# Patient Record
Sex: Male | Born: 1950 | Race: White | Hispanic: No | Smoking: Never smoker
Health system: Southern US, Community
[De-identification: ages and names within clinical notes are randomized; demographics above are authoritative.]

## PROBLEM LIST (undated history)

## (undated) DIAGNOSIS — F419 Anxiety disorder, unspecified: Secondary | ICD-10-CM

## (undated) DIAGNOSIS — F319 Bipolar disorder, unspecified: Secondary | ICD-10-CM

## (undated) DIAGNOSIS — F32A Depression, unspecified: Secondary | ICD-10-CM

## (undated) DIAGNOSIS — M899 Disorder of bone, unspecified: Secondary | ICD-10-CM

## (undated) DIAGNOSIS — F329 Major depressive disorder, single episode, unspecified: Secondary | ICD-10-CM

## (undated) DIAGNOSIS — K219 Gastro-esophageal reflux disease without esophagitis: Secondary | ICD-10-CM

---

## 2003-12-20 ENCOUNTER — Other Ambulatory Visit: Payer: Self-pay

## 2003-12-21 ENCOUNTER — Other Ambulatory Visit: Payer: Self-pay

## 2003-12-22 ENCOUNTER — Other Ambulatory Visit: Payer: Self-pay

## 2003-12-23 ENCOUNTER — Other Ambulatory Visit: Payer: Self-pay

## 2005-11-11 ENCOUNTER — Ambulatory Visit: Payer: Self-pay | Admitting: Surgery

## 2005-11-18 ENCOUNTER — Ambulatory Visit: Payer: Self-pay | Admitting: Surgery

## 2006-01-02 ENCOUNTER — Emergency Department: Payer: Self-pay | Admitting: Emergency Medicine

## 2006-01-04 ENCOUNTER — Ambulatory Visit: Payer: Self-pay

## 2006-01-26 ENCOUNTER — Ambulatory Visit: Payer: Self-pay | Admitting: Surgery

## 2006-02-09 ENCOUNTER — Ambulatory Visit: Payer: Self-pay | Admitting: Surgery

## 2013-09-04 ENCOUNTER — Ambulatory Visit: Payer: Self-pay | Admitting: Gastroenterology

## 2013-09-06 LAB — PATHOLOGY REPORT

## 2014-06-06 ENCOUNTER — Emergency Department: Payer: Self-pay | Admitting: Student

## 2014-11-06 ENCOUNTER — Emergency Department
Admission: EM | Admit: 2014-11-06 | Discharge: 2014-11-06 | Disposition: A | Discharge status: Home / Self Care | Payer: Medicare Other | Attending: Student | Admitting: Student

## 2014-11-06 ENCOUNTER — Emergency Department: Payer: Medicare Other

## 2014-11-06 DIAGNOSIS — Y998 Other external cause status: Secondary | ICD-10-CM | POA: Diagnosis not present

## 2014-11-06 DIAGNOSIS — W182XXA Fall in (into) shower or empty bathtub, initial encounter: Secondary | ICD-10-CM | POA: Insufficient documentation

## 2014-11-06 DIAGNOSIS — S0181XA Laceration without foreign body of other part of head, initial encounter: Secondary | ICD-10-CM | POA: Insufficient documentation

## 2014-11-06 DIAGNOSIS — S0990XA Unspecified injury of head, initial encounter: Secondary | ICD-10-CM | POA: Diagnosis present

## 2014-11-06 DIAGNOSIS — Y9289 Other specified places as the place of occurrence of the external cause: Secondary | ICD-10-CM | POA: Diagnosis not present

## 2014-11-06 DIAGNOSIS — IMO0002 Reserved for concepts with insufficient information to code with codable children: Secondary | ICD-10-CM

## 2014-11-06 DIAGNOSIS — Y93E1 Activity, personal bathing and showering: Secondary | ICD-10-CM | POA: Diagnosis not present

## 2014-11-06 DIAGNOSIS — T148XXA Other injury of unspecified body region, initial encounter: Secondary | ICD-10-CM

## 2014-11-06 HISTORY — DX: Anxiety disorder, unspecified: F41.9

## 2014-11-06 HISTORY — DX: Depression, unspecified: F32.A

## 2014-11-06 HISTORY — DX: Major depressive disorder, single episode, unspecified: F32.9

## 2014-11-06 HISTORY — DX: Bipolar disorder, unspecified: F31.9

## 2014-11-06 HISTORY — DX: Disorder of bone, unspecified: M89.9

## 2014-11-06 HISTORY — DX: Gastro-esophageal reflux disease without esophagitis: K21.9

## 2014-11-06 MED ORDER — LIDOCAINE-EPINEPHRINE-TETRACAINE (LET) SOLUTION
3.0000 mL | Freq: Once | NASAL | Status: AC
  Administered 2014-11-06: 3 mL via TOPICAL

## 2014-11-06 MED ORDER — LIDOCAINE-EPINEPHRINE-TETRACAINE (LET) SOLUTION
NASAL | Status: AC
  Filled 2014-11-06: qty 3

## 2014-11-06 MED ORDER — TETANUS-DIPHTHERIA TOXOIDS TD 5-2 LFU IM INJ
INJECTION | INTRAMUSCULAR | Status: AC
  Filled 2014-11-06: qty 0.5

## 2014-11-06 MED ORDER — TETANUS-DIPHTHERIA TOXOIDS TD 5-2 LFU IM INJ
0.5000 mL | INJECTION | Freq: Once | INTRAMUSCULAR | Status: AC
  Administered 2014-11-06: 0.5 mL via INTRAMUSCULAR

## 2014-11-06 NOTE — Discharge Instructions (Signed)
Rest. Keep wound clean and dry.  Follow-up with her primary care physician next week.  Return to the ER for new or worsening concerns.  Contusion A contusion is a deep bruise. Contusions happen when an injury causes bleeding under the skin. Signs of bruising include pain, puffiness (swelling), and discolored skin. The contusion may turn blue, purple, or yellow. HOME CARE   Put ice on the injured area.  Put ice in a plastic bag.  Place a towel between your skin and the bag.  Leave the ice on for 15-20 minutes, 03-04 times a day.  Only take medicine as told by your doctor.  Rest the injured area.  If possible, raise (elevate) the injured area to lessen puffiness. GET HELP RIGHT AWAY IF:   You have more bruising or puffiness.  You have pain that is getting worse.  Your puffiness or pain is not helped by medicine. MAKE SURE YOU:   Understand these instructions.  Will watch your condition.  Will get help right away if you are not doing well or get worse. Document Released: 11/24/2007 Document Revised: 08/30/2011 Document Reviewed: 04/12/2011 [facility] Patient Information 2015 [place]. This information is not intended to replace advice given to you by your health care provider. Make sure you discuss any questions you have with your health care provider.

## 2014-11-06 NOTE — ED Provider Notes (Signed)
[facility] Department Provider Note  ____________________________________________  Time seen: Approximately 9:15 AM  I have reviewed the triage vital signs and the nursing notes.   HISTORY  Chief Complaint Fall historian includes patient in group home staff  HPI Warren NeighborRalph M Cirrincione Jr. is a 64 y.o. male presents to the ER due to laceration sustained from fall this morning while in the shower. Patient states he was in the shower and accidentally slipped and fell forward and hit head on side of tub. Patient and staff denies loss of consciousness. Reports patient quickly then got up and finished his shower. Patient denies headache or loss of consciousness, nausea, vomiting. Reports some neck pain. States mild achy.  Group home staff denies behavior changes or personality changes. Fall occurred at approximately 7 AM. Patient denies headache vision changes nausea or vomiting. Patient denies current neck or back pain.   Past Medical History  Diagnosis Date  . Bipolar 1 disorder   . Depression   . Anxiety   . GERD (gastroesophageal reflux disease)   . Bone disorder     There are no active problems to display for this patient.  PCP: niemeyer History reviewed. No pertinent past surgical history.  No current outpatient prescriptions on file.  Allergies Review of patient's allergies indicates no known allergies.  History reviewed. No pertinent family history.  Social History History  Substance Use Topics  . Smoking status: Never Smoker   . Smokeless tobacco: Not on file  . Alcohol Use: No    Review of Systems Constitutional: No fever/chills Eyes: No visual changes. ENT: No sore throat. Cardiovascular: Denies chest pain. Respiratory: Denies shortness of breath. Gastrointestinal: No abdominal pain.  No nausea, no vomiting.  No diarrhea.  No constipation. Genitourinary: Negative for dysuria. Musculoskeletal: Negative for back pain.neck pain as  above Skin: laceration to forehead Neurological: Negative for headaches, focal weakness or numbness.  10-point ROS otherwise negative.  ____________________________________________   PHYSICAL EXAM:  VITAL SIGNS: ED Triage Vitals  Enc Vitals Group     BP --      Pulse Rate 11/06/14 0857 85     Resp 11/06/14 0857 18     Temp 11/06/14 0857 97.4 F (36.3 C)     Temp Source 11/06/14 0857 Oral     SpO2 11/06/14 0857 95 %     Weight 11/06/14 0857 195 lb (88.451 kg)     Height 11/06/14 0857 6' (1.829 m)     Head Cir --      Peak Flow --      Pain Score --      Pain Loc --      Pain Edu? --      Excl. in GC? --     Constitutional: Alert and oriented. Well appearing and in no acute distress. Eyes: Conjunctivae are normal. PERRL. EOMI. Head: forehead with superficial flap laceration 2cm. Nontender, no swelling, clean. No bleeding.  Nose: No congestion/rhinnorhea. Mouth/Throat: Mucous membranes are moist.  Oropharynx non-erythematous. Neck: No stridor.  NO cervical spine tenderness to palpation. Hematological/Lymphatic/Immunilogical: No cervical lymphadenopathy. Cardiovascular: Normal rate, regular rhythm. Grossly normal heart sounds.  Good peripheral circulation. Respiratory: Normal respiratory effort.  No retractions. Lungs CTAB. Gastrointestinal: Soft and nontender. No distention. No abdominal bruits. No CVA tenderness. Musculoskeletal: No lower extremity tenderness nor edema.  No joint effusions. Neurologic:  Normal speech and language. No gross focal neurologic deficits are appreciated. Speech is normal. No gait instability. Skin:  Skin is warm, dry  and intact. No rash noted. Psychiatric: Mood and affect are normal. Speech and behavior are normal.  ____________________________________________ __________________________________________  RADIOLOGY  CT HEAD WITHOUT CONTRAST  CT CERVICAL SPINE WITHOUT CONTRAST  TECHNIQUE: Multidetector CT imaging of the head and cervical  spine was performed following the standard protocol without intravenous contrast. Multiplanar CT image reconstructions of the cervical spine were also generated.  COMPARISON: None.  FINDINGS: CT HEAD FINDINGS  No acute stroke, acute hemorrhage, mass lesion, or extra-axial fluid. Hydrocephalus ex vacuo. Hypoattenuation of white matter likely representing small vessel disease. Generalized cerebral and cerebellar atrophy.  No skull fracture. Vascular calcification. No significant scalp hematoma or laceration.  CT CERVICAL SPINE FINDINGS  There is no visible cervical spine fracture, traumatic subluxation, prevertebral soft tissue swelling, or intraspinal hematoma. Multilevel disc space narrowing. Functional fusion due to loss of interspace height across C5-C6. Facet mediated degenerative anterolisthesis C6-7 and C7-T1 non worrisome. No pneumothorax. Mild pannus surrounds the odontoid. No neck masses.  IMPRESSION: Chronic changes of cerebral atrophy as described. No skull fracture or intracranial hemorrhage.  Cervical spondylosis without cervical spine fracture or traumatic subluxation.   Electronically Signed By: Davonna BellingJohn Curnes M.D. On: 11/06/2014 10:49 ____________________________________________   PROCEDURES  LACERATION REPAIR Performed by: [name] Authorized by: [name] Consent: Verbal consent obtained. Risks and benefits: risks, benefits and alternatives were discussed Consent given by: patient Patient identity confirmed: provided demographic data Prepped and Draped in normal sterile fashion Wound explored  Laceration Location: forehead  Laceration Length: 2cm superficial   No Foreign Bodies seen or palpated  Anesthesia:topical   Local anesthetic: LET  Irrigation method: syringe Amount of cleaning: copious  Skin closure: closed with topical sterile adhesive  Patient tolerance: Patient tolerated the procedure well with no immediate  complications. ____________________________________________   INITIAL IMPRESSION / ASSESSMENT AND PLAN / ED COURSE  Pertinent labs & imaging results that were available during my care of the patient were reviewed by me and considered in my medical decision making (see chart for details).  Tetanus updated  No acute distress. Very well-appearing. No pain or tenderness elicited on exam. Presented post mechanical fall this morning 7 AM slipped in shower fell forward hitting forehead on the side of tub. Denies loss of consciousness. Group home staff denies behavior changes. Alert and oriented with steady gait. CT head and cervical spine negative for acute changes. Superficial laceration repaired. Discussed follow-up with primary care physician next week. Discussed follow-up and return parameters. Patient and group home staff verbalized understanding and agreed to plan. ____________________________________________   FINAL CLINICAL IMPRESSION(S) / ED DIAGNOSES  Final diagnoses:  Laceration  Contusion      Renford DillsLindsey Daouda Lonzo, NP 11/06/14 1302  Gayla DossEryka A Gayle, MD 11/06/14 [phone]

## 2014-11-06 NOTE — ED Notes (Signed)
Pt fell and hit forehead in the bathtub this am. No loc.

## 2014-12-14 ENCOUNTER — Encounter: Payer: Self-pay | Admitting: Emergency Medicine

## 2014-12-14 ENCOUNTER — Emergency Department
Admission: EM | Admit: 2014-12-14 | Discharge: 2014-12-14 | Disposition: A | Discharge status: Home / Self Care | Payer: Medicare Other | Attending: Emergency Medicine | Admitting: Emergency Medicine

## 2014-12-14 ENCOUNTER — Emergency Department: Payer: Medicare Other

## 2014-12-14 DIAGNOSIS — S0083XA Contusion of other part of head, initial encounter: Secondary | ICD-10-CM

## 2014-12-14 DIAGNOSIS — Y9389 Activity, other specified: Secondary | ICD-10-CM | POA: Diagnosis not present

## 2014-12-14 DIAGNOSIS — S0990XA Unspecified injury of head, initial encounter: Secondary | ICD-10-CM | POA: Diagnosis not present

## 2014-12-14 DIAGNOSIS — W01190A Fall on same level from slipping, tripping and stumbling with subsequent striking against furniture, initial encounter: Secondary | ICD-10-CM | POA: Diagnosis not present

## 2014-12-14 DIAGNOSIS — W19XXXA Unspecified fall, initial encounter: Secondary | ICD-10-CM

## 2014-12-14 DIAGNOSIS — Y998 Other external cause status: Secondary | ICD-10-CM | POA: Insufficient documentation

## 2014-12-14 DIAGNOSIS — S0993XA Unspecified injury of face, initial encounter: Secondary | ICD-10-CM | POA: Diagnosis present

## 2014-12-14 DIAGNOSIS — Y92002 Bathroom of unspecified non-institutional (private) residence single-family (private) house as the place of occurrence of the external cause: Secondary | ICD-10-CM | POA: Insufficient documentation

## 2014-12-14 DIAGNOSIS — S0011XA Contusion of right eyelid and periocular area, initial encounter: Secondary | ICD-10-CM | POA: Insufficient documentation

## 2014-12-14 NOTE — ED Provider Notes (Signed)
[facility] Emergency Department Provider Note  ____________________________________________  Time seen: Approximately 11:25 AM  I have reviewed the triage vital signs and the nursing notes.   HISTORY  Chief Complaint Fall   HPI Warren [name]. is a 64 y.o. male presents to ER with group home representative at bedside post fall. Patient and group home and presented states that patient has been getting in the shower without assistance, as he knows that he needs to wait for assistance. Patient reports that as he was stepping into the shower his foot slipped and he fell forward hitting right face on tub edge. Group home representative reports that he was in the next room and was in the bathroom within seconds after fall. Reports no LOC. Reports patient ambulatory since fall. Reports fall occurred approximately 9:30 this morning.  Patient complains of right facial pain at 5 out of 10 states it source. Denies other pain or injuries. Also reports mild neck pain, but states he has had neck pain for years.   Denies chest pain, shortness of breath, abdominal pain,  back pain or weakness.   Past Medical History  Diagnosis Date  . Bipolar 1 disorder   . Depression   . Anxiety   . GERD (gastroesophageal reflux disease)   . Bone disorder     There are no active problems to display for this patient.   History reviewed. No pertinent past surgical history.  No current outpatient prescriptions on file.  Allergies Review of patient's allergies indicates no known allergies.  No family history on file.  Social History History  Substance Use Topics  . Smoking status: Never Smoker   . Smokeless tobacco: Not on file  . Alcohol Use: No    Review of Systems Constitutional: No fever/chills Eyes: No visual changes. Denies vision changes, blurry vision.  ENT: No sore throat. Cardiovascular: Denies chest pain. Respiratory: Denies shortness of  breath. Gastrointestinal: No abdominal pain.  No nausea, no vomiting.  No diarrhea.  No constipation. Genitourinary: Negative for dysuria. Musculoskeletal: Negative for back pain. Skin: Negative for rash. Neurological: Negative for headaches, focal weakness or numbness.  10-point ROS otherwise negative.  ____________________________________________   PHYSICAL EXAM:  VITAL SIGNS: ED Triage Vitals  Enc Vitals Group     BP 12/14/14 1048 154/94 mmHg     Pulse Rate 12/14/14 1048 71     Resp 12/14/14 1048 18     Temp 12/14/14 1048 97.3 F (36.3 C)     Temp Source 12/14/14 1048 Oral     SpO2 12/14/14 1048 100 %     Weight 12/14/14 1048 190 lb (86.183 kg)     Height 12/14/14 1048 6' (1.829 m)     Head Cir --      Peak Flow --      Pain Score 12/14/14 1049 5     Pain Loc --      Pain Edu? --      Excl. in GC? --     Constitutional: Alert and oriented. Well appearing and in no acute distress. Eyes: Conjunctivae are normal. PERRL. EOMI. Head: right inferior orbit mild to mod TTP, with mild ecchymosis. Nose: No congestion/rhinnorhea. Mouth/Throat: Mucous membranes are moist.  Oropharynx non-erythematous. Neck: No stridor. Minimal cervical spine tenderness to palpation. Full ROM.  Hematological/Lymphatic/Immunilogical: No cervical lymphadenopathy. Cardiovascular: Normal rate, regular rhythm. Grossly normal heart sounds.  Good peripheral circulation. Respiratory: Normal respiratory effort.  No retractions. Lungs CTAB. Gastrointestinal: Soft and nontender. No distention.  No abdominal bruits. No CVA tenderness. Musculoskeletal: No lower extremity tenderness nor edema.  No joint effusions. no thoracic or lumbar tenderness to palpation. Patient ambulatory in) room with steady gait. Patient changes positions quickly without discomfort or distress. Arms and legs nontender with full ROM. Neurologic:  Normal speech and language. No gross focal neurologic deficits are appreciated. Speech is  normal. No gait instability. Skin:  Skin is warm, dry and intact. No rash noted. Psychiatric: Mood and affect are normal. Speech and behavior are normal.  ____________________________________________   RADIOLOGY EXAM: CT HEAD WITHOUT CONTRAST  CT MAXILLOFACIAL WITHOUT CONTRAST  CT CERVICAL SPINE WITHOUT CONTRAST  TECHNIQUE: Multidetector CT imaging of the head, cervical spine, and maxillofacial structures were performed using the standard protocol without intravenous contrast. Multiplanar CT image reconstructions of the cervical spine and maxillofacial structures were also generated.  COMPARISON: Head and C-spine CT - 11/06/2014  FINDINGS: CT HEAD FINDINGS  Similar findings of advanced atrophy with diffuse sulcal prominence centralized volume loss with commensurate ex vacuo dilatation of the ventricular system. Scattered minimal periventricular hypodensities compatible microvascular ischemic disease. The gray-white differentiation is otherwise well maintained without CT evidence of acute large territory infarct. No intraparenchymal or extra-axial mass or hemorrhage. Unchanged size and configuration of the ventricles and basilar cisterns. No midline shift. Intracranial atherosclerosis.  Regional soft tissues appear normal. No displaced calvarial fracture.  CT MAXILLOFACIAL FINDINGS  There is mild soft tissue swelling about the right maxilla (image 30, series 3). No associated radiopaque foreign body or displaced facial fracture.  Polypoid mucosal thickening of the right frontal sinus. The remaining paranasal sinuses and mastoid air cells are normally aerated. No air-fluid levels.  Normal appearance of the bilateral pterygoid plates. No displaced nasal bone fracture. Normal noncontrast appearance of the bilateral orbits and globes. Normal appearance of the mandible. The bilateral mandibular condyles are normally located. Mild degenerative change of the left TMJ.  The patient is edentulous.  CT CERVICAL SPINE FINDINGS  C1 to the superior endplate of T3 is imaged.  There is mild straightening of the expected cervical lordosis. No anterolisthesis or retrolisthesis. The dens is normally positioned between the lateral masses of C1. Normal atlantodental and toe axial articulations. The bilateral facets are normally aligned.  No fracture or static subluxation of the cervical spine. Cervical vertebral body heights are preserved. Prevertebral soft tissues are normal.  There is suspected partial ankylosis of the C5-C6 intervertebral disc space. Mild multilevel cervical spine DDD, worse at C4-C5 and C6-C7 with disc space height loss, endplate irregularity and sclerosis.  Scattered shotty bilateral cervical lymph nodes individually not enlarged by size criteria. There is mild diffuse heterogeneity of the thyroid gland with punctate calcification within the left lobe of the thyroid. No discrete thyroid nodules this noncontrast examination. Limited visualization of lung apices is normal.  IMPRESSION: 1. Mild soft tissue swelling about the right maxilla without associated radiopaque foreign body or displaced facial fracture. 2. Similar findings of atrophy and microvascular ischemic disease without acute intracranial process. 3. No fracture or static subluxation of the cervical spine. 4. Partial ankylosis of the C5-C6 intervertebral disc space. 5. Mild multilevel cervical spine DDD, worse the C4- C5 and C6-C7.   Electronically Signed By: Simonne Come M.D. On: 12/14/2014 12:48  ____________________________________________   ____________________________________________   INITIAL IMPRESSION / ASSESSMENT AND PLAN / ED COURSE  Pertinent labs & imaging results that were available during my care of the patient were reviewed by me and considered in my medical decision making (see  chart for details).   presents the ER with group home  representative post mechanical fall. Patient slipped while getting into the bathtub. Staff reports they were in the next room and were with patient within seconds. No LOC. Patient denies complaints other than mild tenderness to right face. CT head maxillofacial and CT cervical spine negative for acute changes  Except mild soft tissue swelling about the right maxilla without associated foreign body or fracture. Patient to apply ice rest. Follow-up with primary care physician next week. Patient and group home staff or verbalize understanding. ____________________________________________   FINAL CLINICAL IMPRESSION(S) / ED DIAGNOSES  Final diagnoses:  Facial contusion, initial encounter  Fall, initial encounter      Renford Dills, NP 12/14/14 1327  Myrna Blazer, MD 12/14/14 [phone]

## 2014-12-14 NOTE — Discharge Instructions (Signed)
Apply ice. Rest.Have staff assist you in restroom. Follow-up with your primary care physician next week. Return to the ER for new or worsening concerns.  Contusion A contusion is a deep bruise. Contusions are the result of an injury that caused bleeding under the skin. The contusion may turn blue, purple, or yellow. Minor injuries will give you a painless contusion, but more severe contusions may stay painful and swollen for a few weeks.  CAUSES  A contusion is usually caused by a blow, trauma, or direct force to an area of the body. SYMPTOMS   Swelling and redness of the injured area.  Bruising of the injured area.  Tenderness and soreness of the injured area.  Pain. DIAGNOSIS  The diagnosis can be made by taking a history and physical exam. An X-ray, CT scan, or MRI may be needed to determine if there were any associated injuries, such as fractures. TREATMENT  Specific treatment will depend on what area of the body was injured. In general, the best treatment for a contusion is resting, icing, elevating, and applying cold compresses to the injured area. Over-the-counter medicines may also be recommended for pain control. Ask your caregiver what the best treatment is for your contusion. HOME CARE INSTRUCTIONS   Put ice on the injured area.  Put ice in a plastic bag.  Place a towel between your skin and the bag.  Leave the ice on for 15-20 minutes, 3-4 times a day, or as directed by your health care provider.  Only take over-the-counter or prescription medicines for pain, discomfort, or fever as directed by your caregiver. Your caregiver may recommend avoiding anti-inflammatory medicines (aspirin, ibuprofen, and naproxen) for 48 hours because these medicines may increase bruising.  Rest the injured area.  If possible, elevate the injured area to reduce swelling. SEEK IMMEDIATE MEDICAL CARE IF:   You have increased bruising or swelling.  You have pain that is getting  worse.  Your swelling or pain is not relieved with medicines. MAKE SURE YOU:   Understand these instructions.  Will watch your condition.  Will get help right away if you are not doing well or get worse. Document Released: 03/17/2005 Document Revised: 06/12/2013 Document Reviewed: 04/12/2011 [facility] Patient Information 2015 [place], [place]. This information is not intended to replace advice given to you by your health care provider. Make sure you discuss any questions you have with your health care provider.  Facial or Scalp Contusion  A facial or scalp contusion is a deep bruise on the face or head. Contusions happen when an injury causes bleeding under the skin. Signs of bruising include pain, puffiness (swelling), and discolored skin. The contusion may turn blue, purple, or yellow. HOME CARE  Only take medicines as told by your doctor.  Put ice on the injured area.  Put ice in a plastic bag.  Place a towel between your skin and the bag.  Leave the ice on for 20 minutes, 2-3 times a day. GET HELP IF:  You have bite problems.  You have pain when chewing.  You are worried about your face not healing normally. GET HELP RIGHT AWAY IF:   You have severe pain or a headache and medicine does not help.  You are very tired or confused, or your personality changes.  You throw up (vomit).  You have a nosebleed that will not stop.  You see two of everything (double vision) or have blurry vision.  You have fluid coming from your nose or ear.  You have problems walking or using your arms or legs. MAKE SURE YOU:   Understand these instructions.  Will watch your condition.  Will get help right away if you are not doing well or get worse. Document Released: 05/27/2011 Document Revised: 03/28/2013 Document Reviewed: 01/18/2013 [facility] Patient Information 2015 [place], [place]. This information is not intended to replace advice given to you by your health care provider. Make  sure you discuss any questions you have with your health care provider.  Fall Prevention and Home Safety Falls cause injuries and can affect all age groups. It is possible to prevent falls.  HOW TO PREVENT FALLS  Wear shoes with rubber soles that do not have an opening for your toes.  Keep the inside and outside of your house well lit.  Use night lights throughout your home.  Remove clutter from floors.  Clean up floor spills.  Remove throw rugs or fasten them to the floor with carpet tape.  Do not place electrical cords across pathways.  Put grab bars by your tub, shower, and toilet. Do not use towel bars as grab bars.  Put handrails on both sides of the stairway. Fix loose handrails.  Do not climb on stools or stepladders, if possible.  Do not wax your floors.  Repair uneven or unsafe sidewalks, walkways, or stairs.  Keep items you use a lot within reach.  Be aware of pets.  Keep emergency numbers next to the telephone.  Put smoke detectors in your home and near bedrooms. Ask your doctor what other things you can do to prevent falls. Document Released: 04/03/2009 Document Revised: 12/07/2011 Document Reviewed: 09/07/2011 [facility] Patient Information 2015 [place], [place]. This information is not intended to replace advice given to you by your health care provider. Make sure you discuss any questions you have with your health care provider.

## 2014-12-14 NOTE — ED Notes (Signed)
Pt states he was getting out of the shower and his face hit something in the in the shower. Bruising and hematoma noted under right eye. Pt denies LOC denies taking blood thinner medication. Pt reports blurry vision to right eye rates pain 8/10.

## 2014-12-14 NOTE — ED Notes (Signed)
Pt's caregiver states pt fell while getting out of the shower this am, hitting right side of face on tub, bruising and hematoma noted to right side of face, denies any LOC

## 2015-01-03 ENCOUNTER — Emergency Department: Payer: Medicare Other

## 2015-01-03 ENCOUNTER — Encounter: Payer: Self-pay | Admitting: Emergency Medicine

## 2015-01-03 ENCOUNTER — Emergency Department
Admission: EM | Admit: 2015-01-03 | Discharge: 2015-01-03 | Disposition: A | Discharge status: Home / Self Care | Payer: Medicare Other | Attending: Emergency Medicine | Admitting: Emergency Medicine

## 2015-01-03 DIAGNOSIS — Y998 Other external cause status: Secondary | ICD-10-CM | POA: Insufficient documentation

## 2015-01-03 DIAGNOSIS — Y9389 Activity, other specified: Secondary | ICD-10-CM | POA: Diagnosis not present

## 2015-01-03 DIAGNOSIS — W182XXA Fall in (into) shower or empty bathtub, initial encounter: Secondary | ICD-10-CM | POA: Diagnosis not present

## 2015-01-03 DIAGNOSIS — Y9289 Other specified places as the place of occurrence of the external cause: Secondary | ICD-10-CM | POA: Diagnosis not present

## 2015-01-03 DIAGNOSIS — F419 Anxiety disorder, unspecified: Secondary | ICD-10-CM | POA: Insufficient documentation

## 2015-01-03 DIAGNOSIS — S00212A Abrasion of left eyelid and periocular area, initial encounter: Secondary | ICD-10-CM | POA: Insufficient documentation

## 2015-01-03 DIAGNOSIS — S0012XA Contusion of left eyelid and periocular area, initial encounter: Secondary | ICD-10-CM | POA: Diagnosis not present

## 2015-01-03 DIAGNOSIS — S0990XA Unspecified injury of head, initial encounter: Secondary | ICD-10-CM | POA: Diagnosis present

## 2015-01-03 DIAGNOSIS — S0083XA Contusion of other part of head, initial encounter: Secondary | ICD-10-CM

## 2015-01-03 LAB — CBC
HCT: 35.6 % — ABNORMAL LOW (ref 40.0–52.0)
Hemoglobin: 11.9 g/dL — ABNORMAL LOW (ref 13.0–18.0)
MCH: 32.6 pg (ref 26.0–34.0)
MCHC: 33.4 g/dL (ref 32.0–36.0)
MCV: 97.4 fL (ref 80.0–100.0)
PLATELETS: 177 10*3/uL (ref 150–440)
RBC: 3.65 MIL/uL — AB (ref 4.40–5.90)
RDW: 12.3 % (ref 11.5–14.5)
WBC: 8.3 10*3/uL (ref 3.8–10.6)

## 2015-01-03 LAB — BASIC METABOLIC PANEL
ANION GAP: 3 — AB (ref 5–15)
BUN: 23 mg/dL — AB (ref 6–20)
CHLORIDE: 110 mmol/L (ref 101–111)
CO2: 28 mmol/L (ref 22–32)
CREATININE: 1.12 mg/dL (ref 0.61–1.24)
Calcium: 8.7 mg/dL — ABNORMAL LOW (ref 8.9–10.3)
GFR calc Af Amer: >60 mL/min (ref >60)
Glucose, Bld: 95 mg/dL (ref 65–99)
POTASSIUM: 3.7 mmol/L (ref 3.5–5.1)
Sodium: 141 mmol/L (ref 135–145)

## 2015-01-03 NOTE — Discharge Instructions (Signed)
Contusion °A contusion is a deep bruise. Contusions are the result of an injury that caused bleeding under the skin. The contusion may turn blue, purple, or yellow. Minor injuries will give you a painless contusion, but more severe contusions may stay painful and swollen for a few weeks.  °CAUSES  °A contusion is usually caused by a blow, trauma, or direct force to an area of the body. °SYMPTOMS  °· Swelling and redness of the injured area. °· Bruising of the injured area. °· Tenderness and soreness of the injured area. °· Pain. °DIAGNOSIS  °The diagnosis can be made by taking a history and physical exam. An X-ray, CT scan, or MRI may be needed to determine if there were any associated injuries, such as fractures. °TREATMENT  °Specific treatment will depend on what area of the body was injured. In general, the best treatment for a contusion is resting, icing, elevating, and applying cold compresses to the injured area. Over-the-counter medicines may also be recommended for pain control. Ask your caregiver what the best treatment is for your contusion. °HOME CARE INSTRUCTIONS  °· Put ice on the injured area. °¨ Put ice in a plastic bag. °¨ Place a towel between your skin and the bag. °¨ Leave the ice on for 15-20 minutes, 3-4 times a day, or as directed by your health care provider. °· Only take over-the-counter or prescription medicines for pain, discomfort, or fever as directed by your caregiver. Your caregiver may recommend avoiding anti-inflammatory medicines (aspirin, ibuprofen, and naproxen) for 48 hours because these medicines may increase bruising. °· Rest the injured area. °· If possible, elevate the injured area to reduce swelling. °SEEK IMMEDIATE MEDICAL CARE IF:  °· You have increased bruising or swelling. °· You have pain that is getting worse. °· Your swelling or pain is not relieved with medicines. °MAKE SURE YOU:  °· Understand these instructions. °· Will watch your condition. °· Will get help right  away if you are not doing well or get worse. °Document Released: 03/17/2005 Document Revised: 06/12/2013 Document Reviewed: 04/12/2011 °[employer]® Patient Information ©2015 [employer]. This information is not intended to replace advice given to you by your health care provider. Make sure you discuss any questions you have with your health care provider. ° °

## 2015-01-03 NOTE — ED Provider Notes (Signed)
[facility] Emergency Department Provider Note  ____________________________________________  Time seen: On arrival  I have reviewed the triage vital signs and the nursing notes.   HISTORY  Chief Complaint Fall and Head Injury    HPI Warren NeighborRalph M Santy Jr. is a 64 y.o. male who presents after a fall. Patient is from his group home with group home representative reports that patient slipped while getting into the shower and struck his face. Patient complains only of pain on the left eye and cheek area. No neck pain. Abdominal pain. No chest pain. No extremity pain. No loss of consciousness reported. Patient otherwise feels well     Past Medical History  Diagnosis Date  . Bipolar 1 disorder   . Depression   . Anxiety   . GERD (gastroesophageal reflux disease)   . Bone disorder     There are no active problems to display for this patient.   History reviewed. No pertinent past surgical history.  No current outpatient prescriptions on file.  Allergies Review of patient's allergies indicates no known allergies.  No family history on file.  Social History History  Substance Use Topics  . Smoking status: Never Smoker   . Smokeless tobacco: Not on file  . Alcohol Use: No    Review of Systems  Constitutional: Negative for fever. Eyes: Negative for visual changes.  Cardiovascular: Negative for chest pain. Respiratory: Negative for shortness of breath. Gastrointestinal: Negative for abdominal paina.  Musculoskeletal: Negative for back pain. No extremity injury Skin: Positive for abrasion Neurological: Negative for headaches or focal weakness Psychiatric: Positive for anxiety  10-point ROS otherwise negative.  ____________________________________________   PHYSICAL EXAM:  VITAL SIGNS: ED Triage Vitals  Enc Vitals Group     BP --      Pulse Rate 01/03/15 1112 64     Resp 01/03/15 1112 18     Temp 01/03/15 1112 98.1 F (36.7 C)   Temp Source 01/03/15 1112 Oral     SpO2 01/03/15 1112 97 %     Weight 01/03/15 1112 190 lb (86.183 kg)     Height 01/03/15 1112 6' (1.829 m)     Head Cir --      Peak Flow --      Pain Score 01/03/15 1112 2     Pain Loc --      Pain Edu? --      Excl. in GC? --      Constitutional: Alert and oriented. Well appearing and in no distress. Eyes: Conjunctivae are normal.  ENT   Head: Normocephalic swelling and bruising around the left eye   Mouth/Throat: Mucous membranes are moist. Cardiovascular: Normal rate, regular rhythm. Normal and symmetric distal pulses are present in all extremities. No murmurs, rubs, or gallops. Respiratory: Normal respiratory effort without tachypnea nor retractions. Breath sounds are clear and equal bilaterally.  Gastrointestinal: Soft and non-tender in all quadrants. No distention. There is no CVA tenderness. Genitourinary: deferred Musculoskeletal: Nontender with normal range of motion in all extremities. No lower extremity tenderness nor edema. Neurologic:  Normal speech and language. No gross focal neurologic deficits are appreciated. Skin:  Abrasion underneath the left eye, superficial Psychiatric: Mood and affect are normal. Patient exhibits appropriate insight and judgment.  ____________________________________________    LABS (pertinent positives/negatives)  Labs Reviewed  BASIC METABOLIC PANEL - Abnormal; Notable for the following:    BUN 23 (*)    Calcium 8.7 (*)    Anion gap 3 (*)  All other components within normal limits  CBC - Abnormal; Notable for the following:    RBC 3.65 (*)    Hemoglobin 11.9 (*)    HCT 35.6 (*)    All other components within normal limits  URINALYSIS COMPLETEWITH MICROSCOPIC ([facility] ONLY)  CBG MONITORING, ED    ____________________________________________   EKG  ED ECG REPORT I, Jene Every, the attending physician, personally viewed and interpreted this ECG.   Date: 01/03/2015  EKG Time:  11:18 AM  Rate: 68  Rhythm: normal sinus rhythm  Axis: Left axis deviation  Intervals:Left ventricular hypertrophy  ST&T Change: Nonspecific   ____________________________________________    RADIOLOGY I have personally reviewed any xrays that were ordered on this patient: CT scan showed no broken bones  ____________________________________________   PROCEDURES  Procedure(s) performed: none  Critical Care performed: none  ____________________________________________   INITIAL IMPRESSION / ASSESSMENT AND PLAN / ED COURSE  Pertinent labs & imaging results that were available during my care of the patient were reviewed by me and considered in my medical decision making (see chart for details).  Patient with isolated injury to left face we will obtain CT head and CT max face and reevaluate  CT scans show NAD, ok for discharge as patient feels well. Tetanus up to date  ____________________________________________   FINAL CLINICAL IMPRESSION(S) / ED DIAGNOSES  Final diagnoses:  Facial contusion, initial encounter     Jene Every, MD 01/03/15 1605

## 2015-01-03 NOTE — ED Notes (Signed)
Pt in lobby, requesting visitors to buy him a soft drink.

## 2015-01-03 NOTE — ED Notes (Signed)
Caregiver reports pt fell today and hit his left side of face against the house. Pt with hx of falls; pt with bruising to left eye/cheek area.

## 2015-05-20 ENCOUNTER — Emergency Department
Admission: EM | Admit: 2015-05-20 | Discharge: 2015-05-20 | Disposition: A | Discharge status: Home / Self Care | Payer: Medicare Other | Attending: Emergency Medicine | Admitting: Emergency Medicine

## 2015-05-20 ENCOUNTER — Emergency Department: Payer: Medicare Other

## 2015-05-20 ENCOUNTER — Encounter: Payer: Self-pay | Admitting: Medical Oncology

## 2015-05-20 DIAGNOSIS — S0990XA Unspecified injury of head, initial encounter: Secondary | ICD-10-CM | POA: Diagnosis present

## 2015-05-20 DIAGNOSIS — S0083XA Contusion of other part of head, initial encounter: Secondary | ICD-10-CM | POA: Insufficient documentation

## 2015-05-20 DIAGNOSIS — Y92093 Driveway of other non-institutional residence as the place of occurrence of the external cause: Secondary | ICD-10-CM | POA: Insufficient documentation

## 2015-05-20 DIAGNOSIS — W19XXXA Unspecified fall, initial encounter: Secondary | ICD-10-CM

## 2015-05-20 DIAGNOSIS — T148XXA Other injury of unspecified body region, initial encounter: Secondary | ICD-10-CM

## 2015-05-20 DIAGNOSIS — W010XXA Fall on same level from slipping, tripping and stumbling without subsequent striking against object, initial encounter: Secondary | ICD-10-CM | POA: Insufficient documentation

## 2015-05-20 DIAGNOSIS — S00212A Abrasion of left eyelid and periocular area, initial encounter: Secondary | ICD-10-CM | POA: Insufficient documentation

## 2015-05-20 DIAGNOSIS — Y9389 Activity, other specified: Secondary | ICD-10-CM | POA: Diagnosis not present

## 2015-05-20 DIAGNOSIS — Y998 Other external cause status: Secondary | ICD-10-CM | POA: Insufficient documentation

## 2015-05-20 DIAGNOSIS — S40212A Abrasion of left shoulder, initial encounter: Secondary | ICD-10-CM | POA: Insufficient documentation

## 2015-05-20 MED ORDER — ACETAMINOPHEN 325 MG PO TABS
ORAL_TABLET | ORAL | Status: AC
  Administered 2015-05-20: 650 mg via ORAL
  Filled 2015-05-20: qty 2

## 2015-05-20 MED ORDER — ACETAMINOPHEN 325 MG PO TABS
650.0000 mg | ORAL_TABLET | Freq: Once | ORAL | Status: AC
  Administered 2015-05-20: 650 mg via ORAL

## 2015-05-20 NOTE — ED Notes (Signed)
While pt was assisted to the rest room. It was noted that pt ambulates well and is easily weight bearing without complaint of pain.

## 2015-05-20 NOTE — ED Notes (Signed)
Pt Reports that he slipped and fell pta. Pt denies dizziness but is poor historian, here with caregiver from group home. Denies use of blood thinner but has hematoma to left side of face and a small laceration noted. Pt denies LOC.

## 2015-05-20 NOTE — ED Provider Notes (Addendum)
[facility] Emergency Department Provider Note  ____________________________________________   I have reviewed the triage vital signs and the nursing notes.   HISTORY  Chief Complaint Fall    HPI Warren NeighborRalph M Herne Jr. is a 64 y.o. male who is not on Coumadin or blood thinners according to report presents today complaining of having fallen. According to patient and witnesses, patient had a nonstick of a fall onto a driveway. He did not lose consciousness. He bumped his head. He has an abrasion to his left shoulder. Notes reveal that his tetanus is up-to-date. Patient has no particular complaints at this time.He is at his baseline according to staff with him.  Past Medical History  Diagnosis Date  . Bipolar 1 disorder (HCC)   . Depression   . Anxiety   . GERD (gastroesophageal reflux disease)   . Bone disorder     There are no active problems to display for this patient.   History reviewed. No pertinent past surgical history.  No current outpatient prescriptions on file.  Allergies Review of patient's allergies indicates no known allergies.  No family history on file.  Social History Social History  Substance Use Topics  . Smoking status: Never Smoker   . Smokeless tobacco: None  . Alcohol Use: No    Review of Systems Constitutional: No fever/chills Eyes: No visual changes. ENT: No sore throat. No stiff neck no neck pain Cardiovascular: Denies chest pain. Respiratory: Denies shortness of breath. Gastrointestinal:   no vomiting.  No diarrhea.  No constipation. Genitourinary: Negative for dysuria. Musculoskeletal: Negative lower extremity swelling Skin: Negative for rash. Neurological: Negative for headaches, focal weakness or numbness. 10-point ROS otherwise negative.  ____________________________________________   PHYSICAL EXAM:  VITAL SIGNS: ED Triage Vitals  Enc Vitals Group     BP 05/20/15 1112 147/74 mmHg     Pulse Rate 05/20/15  1112 79     Resp 05/20/15 1112 20     Temp 05/20/15 1112 97.9 F (36.6 C)     Temp Source 05/20/15 1112 Oral     SpO2 05/20/15 1112 100 %     Weight 05/20/15 1112 190 lb (86.183 kg)     Height 05/20/15 1112 5\' 10"  (1.778 m)     Head Cir --      Peak Flow --      Pain Score 05/20/15 1113 10     Pain Loc --      Pain Edu? --      Excl. in GC? --     Constitutional: Alert and oriented to name and place and date. Well appearing and in no acute distress. Eyes: Conjunctivae are normal. PERRL. EOMI. Head: Atraumatic. Aside from bruising to the left forehead there is no skull fracture, there is swelling above the left orbit with a small abrasion noted, there is no laceration requiring sutures at this time. Nose: No congestion/rhinnorhea. Mouth/Throat: Mucous membranes are moist.  Oropharynx non-erythematous. Neck: No stridor.   Nontender with no meningismus there is visibly no midline tenderness to the neck or back Cardiovascular: Normal rate, regular rhythm. Grossly normal heart sounds.  Good peripheral circulation. Respiratory: Normal respiratory effort.  No retractions. Lungs CTAB. Abdominal: Soft and nontender. No distention. No guarding no rebound Back:  There is no focal tenderness or step off there is no midline tenderness there are no lesions noted. there is no CVA tenderness Musculoskeletal: No lower extremity tenderness. No joint effusions, no DVT signs strong distal pulses no edema, patient does  not wish to be dissipating exam but to the extent that I can determine there is no evidence of hip or shoulder fracture. There is an abrasion to the left shoulder. There is no bony tenderness noted other places in the arms or legs. Neurologic:  Normal speech and language. No gross focal neurologic deficits are appreciated.  Skin:  Skin is warm, dry and intact. An abrasion noted to the left shoulder Psychiatric: Mood and affect are normal. Speech and behavior are  normal.  ____________________________________________   LABS (all labs ordered are listed, but only abnormal results are displayed)  Labs Reviewed - No data to display ____________________________________________  EKG  I personally interpreted any EKGs ordered by me or triage Normal sinus rhythm rate 73 bpm, LAFB noted, no acute ST elevation or depression ____________________________________________  RADIOLOGY  I reviewed any imaging ordered by me or triage that were performed during my shift ____________________________________________   PROCEDURES  Procedure(s) performed: None  Critical Care performed: None  ____________________________________________   INITIAL IMPRESSION / ASSESSMENT AND PLAN / ED COURSE  Pertinent labs & imaging results that were available during my care of the patient were reviewed by me and considered in my medical decision making (see chart for details).  Non-syncopal witnessed fall with abrasions and bruising noted, CT of the head is negative, do not take if facial CT is warranted at this time. No evidence of zygomatic or nasal injury, no evidence of significant orbital injury not seen on CT of the head. Patient has an abrasion to left shoulder but I can range the shoulder with no evidence of fracture. Given that there is some limitation to my history from this patient we will obtain a x-ray of that area. Tetanus is up-to-date.. ____________________________________________   FINAL CLINICAL IMPRESSION(S) / ED DIAGNOSES  Final diagnoses:  None     Jeanmarie Plant, MD 05/20/15 1435  Jeanmarie Plant, MD 05/20/15 [phone]

## 2015-09-16 ENCOUNTER — Emergency Department: Payer: Medicare Other

## 2015-09-16 ENCOUNTER — Encounter: Payer: Self-pay | Admitting: *Deleted

## 2015-09-16 ENCOUNTER — Emergency Department
Admission: EM | Admit: 2015-09-16 | Discharge: 2015-09-16 | Disposition: A | Discharge status: Home / Self Care | Payer: Medicare Other | Attending: Emergency Medicine | Admitting: Emergency Medicine

## 2015-09-16 DIAGNOSIS — R531 Weakness: Secondary | ICD-10-CM

## 2015-09-16 DIAGNOSIS — R52 Pain, unspecified: Secondary | ICD-10-CM | POA: Diagnosis present

## 2015-09-16 DIAGNOSIS — Q048 Other specified congenital malformations of brain: Secondary | ICD-10-CM | POA: Diagnosis not present

## 2015-09-16 DIAGNOSIS — F329 Major depressive disorder, single episode, unspecified: Secondary | ICD-10-CM | POA: Insufficient documentation

## 2015-09-16 LAB — CBC WITH DIFFERENTIAL/PLATELET
BASOS PCT: 1 %
Basophils Absolute: 0.1 10*3/uL (ref 0–0.1)
EOS ABS: 0.2 10*3/uL (ref 0–0.7)
EOS PCT: 3 %
HEMATOCRIT: 31 % — AB (ref 40.0–52.0)
Hemoglobin: 10.7 g/dL — ABNORMAL LOW (ref 13.0–18.0)
LYMPHS ABS: 1.4 10*3/uL (ref 1.0–3.6)
Lymphocytes Relative: 25 %
MCH: 33.1 pg (ref 26.0–34.0)
MCHC: 34.4 g/dL (ref 32.0–36.0)
MCV: 96.2 fL (ref 80.0–100.0)
Monocytes Absolute: 0.5 10*3/uL (ref 0.2–1.0)
Monocytes Relative: 8 %
NEUTROS PCT: 63 %
Neutro Abs: 3.6 10*3/uL (ref 1.4–6.5)
Platelets: 164 10*3/uL (ref 150–440)
RBC: 3.22 MIL/uL — ABNORMAL LOW (ref 4.40–5.90)
RDW: 13.4 % (ref 11.5–14.5)
WBC: 5.8 10*3/uL (ref 3.8–10.6)

## 2015-09-16 LAB — TROPONIN I: Troponin I: <0.03 ng/mL (ref <0.031)

## 2015-09-16 LAB — COMPREHENSIVE METABOLIC PANEL
ALK PHOS: 106 U/L (ref 38–126)
ALT: 14 U/L — ABNORMAL LOW (ref 17–63)
ANION GAP: 3 — AB (ref 5–15)
AST: 22 U/L (ref 15–41)
Albumin: 3 g/dL — ABNORMAL LOW (ref 3.5–5.0)
BILIRUBIN TOTAL: 0.7 mg/dL (ref 0.3–1.2)
BUN: 23 mg/dL — ABNORMAL HIGH (ref 6–20)
CALCIUM: 8.9 mg/dL (ref 8.9–10.3)
CO2: 27 mmol/L (ref 22–32)
Chloride: 110 mmol/L (ref 101–111)
Creatinine, Ser: 0.87 mg/dL (ref 0.61–1.24)
GFR calc Af Amer: >60 mL/min (ref >60)
Glucose, Bld: 113 mg/dL — ABNORMAL HIGH (ref 65–99)
POTASSIUM: 3.4 mmol/L — AB (ref 3.5–5.1)
Sodium: 140 mmol/L (ref 135–145)
TOTAL PROTEIN: 5.7 g/dL — AB (ref 6.5–8.1)

## 2015-09-16 NOTE — ED Notes (Signed)
[facility] his left eye in a fall.

## 2015-09-16 NOTE — ED Notes (Signed)
Warren [name] from the group home contacted and is aware that pt is ready to be discharged and needs to be picked up. He states he is on his way.  Warren [name] contacted at [phone].

## 2015-09-16 NOTE — ED Notes (Signed)
Per EMS report, patient was visiting friends and sitting out on the lawn when he got out of the chair and laid down on the grass, telling his friends he was too weak to sit up. A passerby called EMS. Patient's friends told EMS," There is nothing wrong with him. He is just being lazy." Patient has a swollen, bruised left eye. Patient is alert and oriented to self. Patient states he hurts from the top of his head to the bottom of his feet.

## 2015-09-16 NOTE — Discharge Instructions (Signed)

## 2015-09-16 NOTE — ED Provider Notes (Signed)
[facility] Emergency Department Provider Note     Time seen: ----------------------------------------- 2:20 PM on 09/16/2015 -----------------------------------------    I have reviewed the triage vital signs and the nursing notes.   HISTORY  Chief Complaint No chief complaint on file.    HPI Warren NeighborRalph M Townshend Jr. is a 65 y.o. male who presents to ER for generalized pain and weakness. Patient has history of bipolar disorder with depression and anxiety, his caregiver does not feel like symptoms he is exhibiting are real. Patient states he is sick and there is something wrong with his vital signs. Patient cannot elaborate further than this, unknown how long this has been going on.   Past Medical History  Diagnosis Date  . Bipolar 1 disorder (HCC)   . Depression   . Anxiety   . GERD (gastroesophageal reflux disease)   . Bone disorder     There are no active problems to display for this patient.   No past surgical history on file.  Allergies Review of patient's allergies indicates no known allergies.  Social History Social History  Substance Use Topics  . Smoking status: Never Smoker   . Smokeless tobacco: Not on file  . Alcohol Use: No    Review of Systems Constitutional: Negative for fever. Eyes: Negative for visual changes. ENT: Negative for sore throat. Cardiovascular: Negative for chest pain. Respiratory: Negative for shortness of breath. Gastrointestinal: Negative for abdominal pain, vomiting and diarrhea. Genitourinary: Negative for dysuria. Musculoskeletal: Negative for back pain. Skin: Negative for rash. Neurological: Negative for headaches, Positive for weakness  10-point ROS otherwise negative.  ____________________________________________   PHYSICAL EXAM:  VITAL SIGNS: ED Triage Vitals  Enc Vitals Group     BP --      Pulse --      Resp --      Temp --      Temp src --      SpO2 --      Weight --      Height --       Head Cir --      Peak Flow --      Pain Score --      Pain Loc --      Pain Edu? --      Excl. in GC? --     Constitutional: Alert, no distress. Eyes: Conjunctivae are normal. PERRL. Normal extraocular movements. ENT   Head: Left infraorbital ecchymosis   Nose: No congestion/rhinnorhea.   Mouth/Throat: Mucous membranes are moist.   Neck: No stridor. Cardiovascular: Normal rate, regular rhythm. Normal and symmetric distal pulses are present in all extremities. No murmurs, rubs, or gallops. Respiratory: Normal respiratory effort without tachypnea nor retractions. Breath sounds are clear and equal bilaterally. No wheezes/rales/rhonchi. Gastrointestinal: Soft and nontender. No distention. No abdominal bruits.  Musculoskeletal: Nontender with normal range of motion in all extremities. No joint effusions.  No lower extremity tenderness nor edema. Neurologic:  Normal speech and language. No gross focal neurologic deficits are appreciated.  Skin:  Skin is warm, dry and intact. No rash noted. Psychiatric: Bizarre mood and affect at times. ____________________________________________  EKG: Interpreted by me. Sinus rhythm rate of 61 bpm, normal PR interval, wide QRS, normal QT interval. Left axis deviation, LVH  ____________________________________________  ED COURSE:  Pertinent labs & imaging results that were available during my care of the patient were reviewed by me and considered in my medical decision making (see chart for details). Patient with nonspecific weakness and  pain diffusely, I will check basic labs and reevaluate. ____________________________________________    LABS (pertinent positives/negatives)  Labs Reviewed  CBC WITH DIFFERENTIAL/PLATELET  COMPREHENSIVE METABOLIC PANEL  TROPONIN I  URINALYSIS COMPLETEWITH MICROSCOPIC ([facility] ONLY)    RADIOLOGY Images were viewed by me  CT head IMPRESSION: No acute disease.  IMPRESSION: Stable age advanced  cerebral atrophy, ventriculomegaly and periventricular white matter disease.  No acute intracranial findings. ____________________________________________  FINAL ASSESSMENT AND PLAN  Weakness  Plan: Patient with labs and imaging as dictated above. He is stable for outpatient follow-up with his doctor.   Emily Filbert, MD   Emily Filbert, MD 09/16/15 [phone]

## 2015-10-31 ENCOUNTER — Emergency Department: Payer: Medicare Other

## 2015-10-31 ENCOUNTER — Emergency Department
Admission: EM | Admit: 2015-10-31 | Discharge: 2015-10-31 | Disposition: A | Discharge status: Home / Self Care | Payer: Medicare Other | Attending: Emergency Medicine | Admitting: Emergency Medicine

## 2015-10-31 ENCOUNTER — Encounter: Payer: Self-pay | Admitting: Emergency Medicine

## 2015-10-31 DIAGNOSIS — Y9301 Activity, walking, marching and hiking: Secondary | ICD-10-CM | POA: Insufficient documentation

## 2015-10-31 DIAGNOSIS — Y999 Unspecified external cause status: Secondary | ICD-10-CM | POA: Diagnosis not present

## 2015-10-31 DIAGNOSIS — S80811A Abrasion, right lower leg, initial encounter: Secondary | ICD-10-CM | POA: Diagnosis not present

## 2015-10-31 DIAGNOSIS — Y929 Unspecified place or not applicable: Secondary | ICD-10-CM | POA: Insufficient documentation

## 2015-10-31 DIAGNOSIS — W1839XA Other fall on same level, initial encounter: Secondary | ICD-10-CM | POA: Insufficient documentation

## 2015-10-31 DIAGNOSIS — S0990XA Unspecified injury of head, initial encounter: Secondary | ICD-10-CM | POA: Diagnosis present

## 2015-10-31 DIAGNOSIS — S0083XA Contusion of other part of head, initial encounter: Secondary | ICD-10-CM | POA: Insufficient documentation

## 2015-10-31 DIAGNOSIS — F319 Bipolar disorder, unspecified: Secondary | ICD-10-CM | POA: Diagnosis not present

## 2015-10-31 DIAGNOSIS — T148XXA Other injury of unspecified body region, initial encounter: Secondary | ICD-10-CM

## 2015-10-31 MED ORDER — BACITRACIN ZINC 500 UNIT/GM EX OINT
1.0000 "application " | TOPICAL_OINTMENT | Freq: Two times a day (BID) | CUTANEOUS | Status: DC
  Filled 2015-10-31: qty 0.9

## 2015-10-31 NOTE — ED Notes (Signed)
Per caregiver he stumbled and fell  Abrasions and swelling noted to left side of face

## 2015-10-31 NOTE — ED Notes (Signed)
Patient to ER for c/o fall after stumbling over his feet. Patient has abrasion to left face, skin tear to left shin. Patient appropriate at baseline per caregiver. Denies LOC.

## 2015-10-31 NOTE — Discharge Instructions (Signed)
Apply an icepack to the left forehead/eye off and on throughout the next 2 days. Apply neosporin or antibiotic ointment to the abrasions on the face and right leg 2 times per day until healed. Give tylenol 650mg  every 4 hours if needed for headache. Follow up with the PCP for symptoms of concern or return to the ER if unable to schedule an appointment.

## 2015-10-31 NOTE — ED Provider Notes (Signed)
[facility] Emergency Department Provider Note  ____________________________________________  Time seen: Approximately 2:18 PM  I have reviewed the triage vital signs and the nursing notes.   HISTORY  Chief Complaint Fall   HPI Eleanora NeighborRalph M Meharg Jr. is a 65 y.o. male who presents to the emergency department for evaluation after sustaining a mechanical, nonsyncopal fall just prior to arrival. His caregiver states [place] and got his "feet tangled up" and fell landing on the concrete. No loss of consciousness. Patient complaining of pain to the left forehead/eye and right shin.    Past Medical History  Diagnosis Date  . Bipolar 1 disorder (HCC)   . Depression   . Anxiety   . GERD (gastroesophageal reflux disease)   . Bone disorder     There are no active problems to display for this patient.   History reviewed. No pertinent past surgical history.  No current outpatient prescriptions on file.  Allergies Review of patient's allergies indicates no known allergies.  No family history on file.  Social History Social History  Substance Use Topics  . Smoking status: Never Smoker   . Smokeless tobacco: None  . Alcohol Use: No    Review of Systems Constitutional: No fever/chills Eyes: No visual changes. ENT: No sore throat. Cardiovascular: Denies chest pain. Respiratory: Denies shortness of breath. Gastrointestinal: No abdominal pain.  No nausea, no vomiting. Musculoskeletal: Negative for back pain. Negative for neck pain. Skin: Positive for abrasions to face and right lower extremity. Neurological: At baseline per caregiver _________________________________________   PHYSICAL EXAM:  VITAL SIGNS: ED Triage Vitals  Enc Vitals Group     BP 10/31/15 1407 109/59 mmHg     Pulse Rate 10/31/15 1407 55     Resp 10/31/15 1407 18     Temp 10/31/15 1407 98 F (36.7 C)     Temp Source 10/31/15 1407 Oral     SpO2 10/31/15  1407 100 %     Weight 10/31/15 1407 200 lb (90.719 kg)     Height 10/31/15 1407 6' (1.829 m)     Head Cir --      Peak Flow --      Pain Score --      Pain Loc --      Pain Edu? --      Excl. in GC? --     Constitutional: Alert and oriented. Well appearing and in no acute distress. Eyes: Conjunctivae are normal. PERRL. EOMI. Head: Contusion around left eye and left side of forehead. Mouth/Throat: Mucous membranes are moist.  Oropharynx non-erythematous. Neck: Nexus Criteria Negative. Respiratory: Normal respiratory effort.  No retractions. Lungs CTAB. Gastrointestinal: Soft and nontender. No distention. No abdominal bruits. Musculoskeletal: FROM of all extremities. No joint effusions. Neurologic:  At baseline. Skin:  Abrasions to left side of forehead and cheek. Pretibial abrasion on the right lower extremity. Psychiatric: Mood and affect are normal. Speech and behavior are at baseline per caregiver.  ____________________________________________   LABS (all labs ordered are listed, but only abnormal results are displayed)  Labs Reviewed - No data to display ____________________________________________  EKG   ____________________________________________  RADIOLOGY  No acute abnormality of the head or cervical spine per radiology. ____________________________________________   PROCEDURES  Procedure(s) performed: None  Critical Care performed: No  ____________________________________________   INITIAL IMPRESSION / ASSESSMENT AND PLAN / ED COURSE  Pertinent labs & imaging results that were available during my care of the patient were reviewed  by me and considered in my medical decision making (see chart for details).  Abrasions cleaned and bacitracin applied by the RN. Patient to take tylenol every 4-6 hours prn pain/headache. Return precautions discussed with caregiver. ____________________________________________   FINAL CLINICAL IMPRESSION(S) / ED  DIAGNOSES  Final diagnoses:  Minor head injury, initial encounter  Facial contusion, initial encounter  Abrasion      NEW MEDICATIONS STARTED DURING THIS VISIT:  There are no discharge medications for this patient.    Note:  This document was prepared using Dragon voice recognition software and may include unintentional dictation errors.    Chinita Pester, FNP 10/31/15 1733  Minna Antis, MD 11/02/15 [phone]

## 2015-10-31 NOTE — ED Notes (Signed)
Patient transported to X-ray 

## 2016-02-26 ENCOUNTER — Inpatient Hospital Stay
Admission: EM | Admit: 2016-02-26 | Discharge: 2016-03-04 | DRG: 884 | Disposition: A | Discharge status: Skilled Nursing Facility | Payer: Medicare Other | Attending: Specialist | Admitting: Specialist

## 2016-02-26 ENCOUNTER — Emergency Department: Payer: Medicare Other

## 2016-02-26 ENCOUNTER — Observation Stay: Payer: Medicare Other

## 2016-02-26 ENCOUNTER — Encounter: Payer: Self-pay | Admitting: Internal Medicine

## 2016-02-26 ENCOUNTER — Observation Stay
Admit: 2016-02-26 | Discharge: 2016-02-26 | Disposition: A | Discharge status: Home / Self Care | Payer: Medicare Other | Attending: Internal Medicine | Admitting: Internal Medicine

## 2016-02-26 ENCOUNTER — Inpatient Hospital Stay: Payer: Medicare Other

## 2016-02-26 DIAGNOSIS — R259 Unspecified abnormal involuntary movements: Secondary | ICD-10-CM | POA: Diagnosis not present

## 2016-02-26 DIAGNOSIS — F319 Bipolar disorder, unspecified: Secondary | ICD-10-CM | POA: Diagnosis present

## 2016-02-26 DIAGNOSIS — R531 Weakness: Secondary | ICD-10-CM

## 2016-02-26 DIAGNOSIS — R4020 Unspecified coma: Secondary | ICD-10-CM | POA: Diagnosis not present

## 2016-02-26 DIAGNOSIS — Z6820 Body mass index (BMI) 20.0-20.9, adult: Secondary | ICD-10-CM | POA: Diagnosis not present

## 2016-02-26 DIAGNOSIS — I469 Cardiac arrest, cause unspecified: Secondary | ICD-10-CM | POA: Diagnosis not present

## 2016-02-26 DIAGNOSIS — Z79899 Other long term (current) drug therapy: Secondary | ICD-10-CM | POA: Diagnosis not present

## 2016-02-26 DIAGNOSIS — J9601 Acute respiratory failure with hypoxia: Secondary | ICD-10-CM | POA: Diagnosis not present

## 2016-02-26 DIAGNOSIS — I513 Intracardiac thrombosis, not elsewhere classified: Secondary | ICD-10-CM | POA: Diagnosis present

## 2016-02-26 DIAGNOSIS — I639 Cerebral infarction, unspecified: Secondary | ICD-10-CM

## 2016-02-26 DIAGNOSIS — J690 Pneumonitis due to inhalation of food and vomit: Secondary | ICD-10-CM | POA: Diagnosis not present

## 2016-02-26 DIAGNOSIS — E785 Hyperlipidemia, unspecified: Secondary | ICD-10-CM | POA: Diagnosis present

## 2016-02-26 DIAGNOSIS — F0391 Unspecified dementia with behavioral disturbance: Principal | ICD-10-CM | POA: Diagnosis present

## 2016-02-26 DIAGNOSIS — J9690 Respiratory failure, unspecified, unspecified whether with hypoxia or hypercapnia: Secondary | ICD-10-CM | POA: Diagnosis present

## 2016-02-26 DIAGNOSIS — E119 Type 2 diabetes mellitus without complications: Secondary | ICD-10-CM | POA: Diagnosis present

## 2016-02-26 DIAGNOSIS — E440 Moderate protein-calorie malnutrition: Secondary | ICD-10-CM | POA: Diagnosis present

## 2016-02-26 DIAGNOSIS — Z978 Presence of other specified devices: Secondary | ICD-10-CM

## 2016-02-26 DIAGNOSIS — R4182 Altered mental status, unspecified: Secondary | ICD-10-CM

## 2016-02-26 DIAGNOSIS — G9340 Encephalopathy, unspecified: Secondary | ICD-10-CM | POA: Diagnosis present

## 2016-02-26 DIAGNOSIS — Z4659 Encounter for fitting and adjustment of other gastrointestinal appliance and device: Secondary | ICD-10-CM

## 2016-02-26 DIAGNOSIS — K219 Gastro-esophageal reflux disease without esophagitis: Secondary | ICD-10-CM | POA: Diagnosis present

## 2016-02-26 DIAGNOSIS — T17908A Unspecified foreign body in respiratory tract, part unspecified causing other injury, initial encounter: Secondary | ICD-10-CM

## 2016-02-26 DIAGNOSIS — F419 Anxiety disorder, unspecified: Secondary | ICD-10-CM | POA: Diagnosis present

## 2016-02-26 LAB — COMPREHENSIVE METABOLIC PANEL
ALBUMIN: 3.6 g/dL (ref 3.5–5.0)
ALT: 20 U/L (ref 17–63)
AST: 29 U/L (ref 15–41)
Alkaline Phosphatase: 87 U/L (ref 38–126)
Anion gap: 5 (ref 5–15)
BILIRUBIN TOTAL: 1.2 mg/dL (ref 0.3–1.2)
BUN: 33 mg/dL — AB (ref 6–20)
CO2: 30 mmol/L (ref 22–32)
CREATININE: 0.98 mg/dL (ref 0.61–1.24)
Calcium: 9.3 mg/dL (ref 8.9–10.3)
Chloride: 110 mmol/L (ref 101–111)
GFR calc Af Amer: >60 mL/min (ref >60)
GLUCOSE: 89 mg/dL (ref 65–99)
POTASSIUM: 4.9 mmol/L (ref 3.5–5.1)
Sodium: 145 mmol/L (ref 135–145)
TOTAL PROTEIN: 6.6 g/dL (ref 6.5–8.1)

## 2016-02-26 LAB — CBC WITH DIFFERENTIAL/PLATELET
BASOS ABS: 0.1 10*3/uL (ref 0–0.1)
BASOS PCT: 1 %
Eosinophils Absolute: 0.1 10*3/uL (ref 0–0.7)
Eosinophils Relative: 1 %
HEMATOCRIT: 35.3 % — AB (ref 40.0–52.0)
HEMOGLOBIN: 12.3 g/dL — AB (ref 13.0–18.0)
LYMPHS PCT: 13 %
Lymphs Abs: 1.3 10*3/uL (ref 1.0–3.6)
MCH: 34.2 pg — ABNORMAL HIGH (ref 26.0–34.0)
MCHC: 34.8 g/dL (ref 32.0–36.0)
MCV: 98.3 fL (ref 80.0–100.0)
Monocytes Absolute: 0.6 10*3/uL (ref 0.2–1.0)
Monocytes Relative: 6 %
NEUTROS ABS: 7.6 10*3/uL — AB (ref 1.4–6.5)
NEUTROS PCT: 79 %
Platelets: 145 10*3/uL — ABNORMAL LOW (ref 150–440)
RBC: 3.59 MIL/uL — AB (ref 4.40–5.90)
RDW: 13.2 % (ref 11.5–14.5)
WBC: 9.6 10*3/uL (ref 3.8–10.6)

## 2016-02-26 LAB — URINALYSIS COMPLETE WITH MICROSCOPIC (ARMC ONLY)
BILIRUBIN URINE: NEGATIVE
Glucose, UA: NEGATIVE mg/dL
Hgb urine dipstick: NEGATIVE
Leukocytes, UA: NEGATIVE
Nitrite: NEGATIVE
PH: 6 (ref 5.0–8.0)
PROTEIN: 30 mg/dL — AB
Specific Gravity, Urine: 1.021 (ref 1.005–1.030)

## 2016-02-26 LAB — GLUCOSE, CAPILLARY: GLUCOSE-CAPILLARY: 113 mg/dL — AB (ref 65–99)

## 2016-02-26 LAB — MRSA PCR SCREENING: MRSA by PCR: NEGATIVE

## 2016-02-26 LAB — LIPID PANEL
Cholesterol: 133 mg/dL (ref 0–200)
HDL: 54 mg/dL (ref >40)
LDL CALC: 71 mg/dL (ref 0–99)
Total CHOL/HDL Ratio: 2.5 RATIO
Triglycerides: 39 mg/dL (ref <150)
VLDL: 8 mg/dL (ref 0–40)

## 2016-02-26 LAB — TROPONIN I
Troponin I: <0.03 ng/mL (ref <0.03)
Troponin I: <0.03 ng/mL (ref <0.03)

## 2016-02-26 LAB — MAGNESIUM: MAGNESIUM: 1.8 mg/dL (ref 1.7–2.4)

## 2016-02-26 MED ORDER — ENOXAPARIN SODIUM 40 MG/0.4ML ~~LOC~~ SOLN
40.0000 mg | SUBCUTANEOUS | Status: DC
  Administered 2016-02-26: 40 mg via SUBCUTANEOUS
  Filled 2016-02-26: qty 0.4

## 2016-02-26 MED ORDER — LORAZEPAM 2 MG/ML IJ SOLN
2.0000 mg | Freq: Four times a day (QID) | INTRAMUSCULAR | Status: DC | PRN
  Filled 2016-02-26: qty 1

## 2016-02-26 MED ORDER — FENTANYL 2500MCG IN NS 250ML (10MCG/ML) PREMIX INFUSION
25.0000 ug/h | INTRAVENOUS | Status: DC
  Administered 2016-02-26: 50 ug/h via INTRAVENOUS
  Filled 2016-02-26: qty 250

## 2016-02-26 MED ORDER — DONEPEZIL HCL 5 MG PO TABS
10.0000 mg | ORAL_TABLET | Freq: Every day | ORAL | Status: DC
  Administered 2016-02-27 – 2016-03-04 (×7): 10 mg via ORAL
  Filled 2016-02-26 (×7): qty 2

## 2016-02-26 MED ORDER — FAMOTIDINE IN NACL 20-0.9 MG/50ML-% IV SOLN
20.0000 mg | Freq: Two times a day (BID) | INTRAVENOUS | Status: DC
  Administered 2016-02-26 – 2016-03-01 (×8): 20 mg via INTRAVENOUS
  Filled 2016-02-26 (×10): qty 50

## 2016-02-26 MED ORDER — SODIUM CHLORIDE 0.9 % IV SOLN: 250.0000 mL | INTRAVENOUS | Status: DC | PRN

## 2016-02-26 MED ORDER — LORAZEPAM 2 MG/ML IJ SOLN: 1.0000 mg | INTRAMUSCULAR | Status: DC | PRN

## 2016-02-26 MED ORDER — MIDAZOLAM HCL 2 MG/2ML IJ SOLN
2.0000 mg | INTRAMUSCULAR | Status: DC | PRN
  Administered 2016-02-26: 2 mg via INTRAVENOUS

## 2016-02-26 MED ORDER — CHLORHEXIDINE GLUCONATE 0.12 % MT SOLN
15.0000 mL | Freq: Two times a day (BID) | OROMUCOSAL | Status: DC
  Administered 2016-02-27 – 2016-03-04 (×13): 15 mL via OROMUCOSAL
  Filled 2016-02-26 (×11): qty 15

## 2016-02-26 MED ORDER — LORAZEPAM 2 MG/ML IJ SOLN
1.0000 mg | INTRAMUSCULAR | Status: DC | PRN
  Administered 2016-02-26 – 2016-02-27 (×2): 1 mg via INTRAVENOUS
  Filled 2016-02-26 (×4): qty 1

## 2016-02-26 MED ORDER — LEVOTHYROXINE SODIUM 25 MCG PO TABS
25.0000 ug | ORAL_TABLET | ORAL | Status: DC
  Administered 2016-02-27 – 2016-03-04 (×7): 25 ug via ORAL
  Filled 2016-02-26 (×7): qty 1

## 2016-02-26 MED ORDER — OMEGA-3-ACID ETHYL ESTERS 1 G PO CAPS
1.0000 g | ORAL_CAPSULE | Freq: Every day | ORAL | Status: DC
  Administered 2016-02-29 – 2016-03-04 (×5): 1 g via ORAL
  Filled 2016-02-26 (×6): qty 1

## 2016-02-26 MED ORDER — LEVETIRACETAM 500 MG/5ML IV SOLN
1000.0000 mg | Freq: Once | INTRAVENOUS | Status: AC
  Administered 2016-02-26: 1000 mg via INTRAVENOUS
  Filled 2016-02-26: qty 10

## 2016-02-26 MED ORDER — PIPERACILLIN-TAZOBACTAM 3.375 G IVPB
3.3750 g | Freq: Three times a day (TID) | INTRAVENOUS | Status: DC
  Administered 2016-02-26 – 2016-03-02 (×15): 3.375 g via INTRAVENOUS
  Filled 2016-02-26 (×11): qty 50

## 2016-02-26 MED ORDER — FENTANYL BOLUS VIA INFUSION
50.0000 ug | INTRAVENOUS | Status: DC | PRN
  Filled 2016-02-26: qty 50

## 2016-02-26 MED ORDER — VITAMIN D (ERGOCALCIFEROL) 1.25 MG (50000 UNIT) PO CAPS
50000.0000 [IU] | ORAL_CAPSULE | ORAL | Status: DC
  Filled 2016-02-26: qty 1

## 2016-02-26 MED ORDER — FAMOTIDINE IN NACL 20-0.9 MG/50ML-% IV SOLN: 20.0000 mg | Freq: Two times a day (BID) | INTRAVENOUS | Status: DC

## 2016-02-26 MED ORDER — PANTOPRAZOLE SODIUM 40 MG PO TBEC
40.0000 mg | DELAYED_RELEASE_TABLET | Freq: Every day | ORAL | Status: DC
  Administered 2016-02-27: 40 mg via ORAL
  Filled 2016-02-26: qty 1

## 2016-02-26 MED ORDER — MEMANTINE HCL-DONEPEZIL HCL ER 28-10 MG PO CP24: 1.0000 | ORAL_CAPSULE | Freq: Every day | ORAL | Status: DC

## 2016-02-26 MED ORDER — DEXTROSE 5 % IV SOLN
INTRAVENOUS | Status: DC
  Administered 2016-02-26 – 2016-02-28 (×2): via INTRAVENOUS

## 2016-02-26 MED ORDER — CHLORPROMAZINE HCL 50 MG PO TABS
50.0000 mg | ORAL_TABLET | Freq: Two times a day (BID) | ORAL | Status: DC
Start: 2016-02-26 — End: 2016-03-04
  Administered 2016-02-27 – 2016-03-04 (×13): 50 mg via ORAL
  Filled 2016-02-26 (×15): qty 1

## 2016-02-26 MED ORDER — BENZTROPINE MESYLATE 1 MG PO TABS
1.0000 mg | ORAL_TABLET | Freq: Every day | ORAL | Status: DC
  Administered 2016-02-26 – 2016-03-03 (×7): 1 mg via ORAL
  Filled 2016-02-26 (×7): qty 1

## 2016-02-26 MED ORDER — RISPERIDONE 3 MG PO TABS
3.0000 mg | ORAL_TABLET | Freq: Two times a day (BID) | ORAL | Status: DC
  Administered 2016-02-27 – 2016-03-04 (×13): 3 mg via ORAL
  Filled 2016-02-26 (×2): qty 6
  Filled 2016-02-26 (×4): qty 1
  Filled 2016-02-26: qty 6
  Filled 2016-02-26 (×5): qty 1

## 2016-02-26 MED ORDER — ESCITALOPRAM OXALATE 10 MG PO TABS
10.0000 mg | ORAL_TABLET | Freq: Every day | ORAL | Status: DC
  Administered 2016-02-27 – 2016-03-04 (×7): 10 mg via ORAL
  Filled 2016-02-26 (×7): qty 1

## 2016-02-26 MED ORDER — SODIUM CHLORIDE 0.9 % IV SOLN: INTRAVENOUS | Status: DC

## 2016-02-26 MED ORDER — ONDANSETRON HCL 4 MG/2ML IJ SOLN: 4.0000 mg | Freq: Four times a day (QID) | INTRAMUSCULAR | Status: DC | PRN

## 2016-02-26 MED ORDER — ACETAMINOPHEN 500 MG PO TABS: 500.0000 mg | ORAL_TABLET | Freq: Four times a day (QID) | ORAL | Status: DC | PRN

## 2016-02-26 MED ORDER — SODIUM CHLORIDE 0.9% FLUSH
3.0000 mL | Freq: Two times a day (BID) | INTRAVENOUS | Status: DC
  Administered 2016-02-26 – 2016-03-04 (×10): 3 mL via INTRAVENOUS

## 2016-02-26 MED ORDER — SODIUM CHLORIDE 0.9% FLUSH: 3.0000 mL | INTRAVENOUS | Status: DC | PRN

## 2016-02-26 MED ORDER — ALPRAZOLAM 1 MG PO TABS: 1.0000 mg | ORAL_TABLET | Freq: Two times a day (BID) | ORAL | Status: DC | PRN

## 2016-02-26 MED ORDER — ENOXAPARIN SODIUM 40 MG/0.4ML ~~LOC~~ SOLN: 40.0000 mg | SUBCUTANEOUS | Status: DC

## 2016-02-26 MED ORDER — ORAL CARE MOUTH RINSE
15.0000 mL | Freq: Four times a day (QID) | OROMUCOSAL | Status: DC
  Administered 2016-02-27 (×4): 15 mL via OROMUCOSAL

## 2016-02-26 MED ORDER — HALOPERIDOL 0.5 MG PO TABS
5.0000 mg | ORAL_TABLET | Freq: Two times a day (BID) | ORAL | Status: DC
  Administered 2016-02-27: 5 mg via ORAL
  Filled 2016-02-26: qty 10
  Filled 2016-02-26: qty 1
  Filled 2016-02-26: qty 10

## 2016-02-26 MED ORDER — ATORVASTATIN CALCIUM 20 MG PO TABS
40.0000 mg | ORAL_TABLET | Freq: Every day | ORAL | Status: DC
  Administered 2016-02-26 – 2016-03-03 (×6): 40 mg via ORAL
  Filled 2016-02-26 (×6): qty 2

## 2016-02-26 MED ORDER — LORAZEPAM 2 MG/ML IJ SOLN
2.0000 mg | Freq: Once | INTRAMUSCULAR | Status: AC
  Administered 2016-02-26: 2 mg via INTRAVENOUS

## 2016-02-26 MED ORDER — MEMANTINE HCL ER 14 MG PO CP24
28.0000 mg | ORAL_CAPSULE | Freq: Every day | ORAL | Status: DC
  Administered 2016-02-27 – 2016-03-04 (×7): 28 mg via ORAL
  Filled 2016-02-26: qty 1
  Filled 2016-02-26 (×4): qty 2
  Filled 2016-02-26: qty 1
  Filled 2016-02-26: qty 2

## 2016-02-26 MED ORDER — FENTANYL CITRATE (PF) 100 MCG/2ML IJ SOLN
50.0000 ug | Freq: Once | INTRAMUSCULAR | Status: DC
  Filled 2016-02-26 (×2): qty 2

## 2016-02-26 MED ORDER — ACETAMINOPHEN 325 MG PO TABS: 650.0000 mg | ORAL_TABLET | ORAL | Status: DC | PRN

## 2016-02-26 MED ORDER — MIDAZOLAM HCL 2 MG/2ML IJ SOLN
2.0000 mg | INTRAMUSCULAR | Status: DC | PRN
  Filled 2016-02-26: qty 2

## 2016-02-26 NOTE — ED Notes (Signed)
Pt back from ct and report received. Pt still needs a urine obtained. Caregiver in the room

## 2016-02-26 NOTE — ED Provider Notes (Signed)
[name] Department Provider Note ____________________________________________   I have reviewed the triage vital signs and the triage nursing note.  HISTORY  Chief Complaint Altered Mental Status and Dysuria   Historian Level 5 caveat due to patient poor historian/illness Patient's group care home staff member provides history  HPI [name]. is a 65 y.o. male with mental illness, lives at a group home and was noted to be less responsive this morning. Typically he talks a lot and he wasn't talking much this morning. No noted focal motor or sensory deficits. Group home staff states the patient tripped coming in the yesterday evening but got right back up and they did not think he had a head injury or any other injury.  They report he hasn't typically had episodes like this.    Past Medical History:  Diagnosis Date  . Anxiety   . Bipolar 1 disorder (HCC)   . Bone disorder   . Depression   . GERD (gastroesophageal reflux disease)     There are no active problems to display for this patient.   No past surgical history on file.  Prior to Admission medications   Not on File    No Known Allergies  No family history on file.  Social History Social History  Substance Use Topics  . Smoking status: Never Smoker  . Smokeless tobacco: Not on file  . Alcohol use No    Review of Systems Limited due to patient's illness and unable to provide history.   Denies headache, chest pain, trouble breathing. ____________________________________________   PHYSICAL EXAM:  VITAL SIGNS: ED Triage Vitals  Enc Vitals Group     BP 02/26/16 0945 (!) 148/66     Pulse Rate 02/26/16 0945 86     Resp 02/26/16 0945 18     Temp 02/26/16 0945 98.2 F (36.8 C)     Temp Source 02/26/16 0945 Oral     SpO2 02/26/16 0945 99 %     Weight 02/26/16 0946 165 lb (74.8 kg)     Height 02/26/16 0946 6' (1.829 m)     Head Circumference --      Peak Flow  --      Pain Score --      Pain Loc --      Pain Edu? --      Excl. in GC? --      Constitutional:Alert with eyes open, follows some commands, but somewhat slow to respond physically and verbally. Well appearing overall and in no acute distress. HEENT   Head: Normocephalic and atraumatic.      Eyes: Conjunctivae are normal. PERRL. Normal extraocular movements.      Ears:         Nose: No congestion/rhinnorhea.   Mouth/Throat: Mucous membranes are moist.   Neck: No stridor. Cardiovascular/Chest: Normal rate, regular rhythm.  No murmurs, rubs, or gallops. Respiratory: Normal respiratory effort without tachypnea nor retractions. Breath sounds are clear and equal bilaterally. No wheezes/rales/rhonchi. Gastrointestinal: Soft. No distention, no guarding, no rebound. Nontender.    Genitourinary/rectal:Deferred Musculoskeletal: Nontender with normal range of motion in all extremities. No joint effusions.  No lower extremity tenderness.  No edema. Neurologic: No slurred speech or facial droop.  Slow to answer questions and follow commands, but does perform requested actions.  Has some contractures of arms/hands (chronic per group home staffer)  No gross or focal neurologic deficits are appreciated. Skin:  Skin is warm, dry and intact. No rash noted.  Psychiatric: Flat affect.  No apparent psychosis.   ____________________________________________  LABS (pertinent positives/negatives)  Labs Reviewed  CBC WITH DIFFERENTIAL/PLATELET - Abnormal; Notable for the following:       Result Value   RBC 3.59 (*)    Hemoglobin 12.3 (*)    HCT 35.3 (*)    MCH 34.2 (*)    Platelets 145 (*)    Neutro Abs 7.6 (*)    All other components within normal limits  COMPREHENSIVE METABOLIC PANEL - Abnormal; Notable for the following:    BUN 33 (*)    All other components within normal limits  URINALYSIS COMPLETEWITH MICROSCOPIC ([facility] ONLY) - Abnormal; Notable for the following:    Color, Urine  AMBER (*)    APPearance HAZY (*)    Ketones, ur TRACE (*)    Protein, ur 30 (*)    Bacteria, UA MANY (*)    Squamous Epithelial / LPF 6-30 (*)    All other components within normal limits  URINE CULTURE  TROPONIN I    ____________________________________________    EKG I, Governor Rooks, MD, the attending physician have personally viewed and interpreted all ECGs.  57 bpm. Nonspecific intraventricular conduction delay. Left axis deviation. Nonspecific T wave. Mild J-point elevation inferiorly ____________________________________________  RADIOLOGY All Xrays were viewed by me. Imaging interpreted by Radiologist.  CT head without contrast: No acute intracranial pathology __________________________________________  PROCEDURES  Procedure(s) performed: None  Critical Care performed: None  ____________________________________________   ED COURSE / ASSESSMENT AND PLAN  Pertinent labs & imaging results that were available during my care of the patient were reviewed by me and considered in my medical decision making (see chart for details).   [facility] [name] was brought in by group home staff for altered mental status this morning described as decreased activity level, decreased verbalization.They were somewhat more concerned given the fact that he had a fall at the doorstep yesterday, although at that time did not appear to have hit his head or had any problems.  On exam, he is a little slow to respond, but does not have any focal motor or sensory deficits.  No fever or reported history of medical illnesses.  This patient is not exactly a great historian, however he is denying immediate pain or distress.   It's unclear to me if this episode today is related to his underside lying psychiatric illness, or even medication side effect. In any event, I am awaiting urinalysis (12pm) and will feed and walk him to determine disposition - hopefully able to go home vs. Observation  admission.  1:30 PM. Urinalysis shows many bacteria, but no white blood cells, nitrites, leukocytes, and there is skin cells. I will send a culture.  On reevaluation the patient is essentially the same as when he came in, but the patient's group home supervisor is here and states this patient has lived with him for 17 years, and this is very abnormal behavior. He is really weak all over and not really able to cooperate well with sitting up or standing, so he will need observation admission.  CONSULTATIONS:   Hospitalist for admission  Patient / Family / Caregiver informed of clinical course, medical decision-making process, and agree with plan.   I discussed return precautions, follow-up instructions, and discharge instructions with patient and/or family.   ___________________________________________   FINAL CLINICAL IMPRESSION(S) / ED DIAGNOSES   Final diagnoses:  Generalized weakness  Altered mental status, unspecified altered mental status type  Note: This dictation was prepared with Dragon dictation. Any transcriptional errors that result from this process are unintentional    Governor Rooksebecca Shataria Crist, MD 02/26/16 1334

## 2016-02-26 NOTE — Progress Notes (Signed)
ANTIBIOTIC CONSULT NOTE - INITIAL  Pharmacy Consult for Zosyn  Indication: pneumonia  No Known Allergies  Patient Measurements: Height: 6' (182.9 cm) Weight: 144 lb 8 oz (65.5 kg) IBW/kg (Calculated) : 77.6 Adjusted Body Weight:   Vital Signs: Temp: 97.6 F (36.4 C) (09/07 2100) Temp Source: Axillary (09/07 2100) BP: 141/76 (09/07 2100) Pulse Rate: 80 (09/07 2100) Intake/Output from previous day: No intake/output data recorded. Intake/Output from this shift: Total I/O In: -  Out: 1 [Urine:1]  Labs:  Recent Labs  02/26/16 1032  WBC 9.6  HGB 12.3*  PLT 145*  CREATININE 0.98   Estimated Creatinine Clearance: 69.6 mL/min (by C-G formula based on SCr of 0.98 mg/dL). No results for input(s): VANCOTROUGH, VANCOPEAK, VANCORANDOM, GENTTROUGH, GENTPEAK, GENTRANDOM, TOBRATROUGH, TOBRAPEAK, TOBRARND, AMIKACINPEAK, AMIKACINTROU, AMIKACIN in the last 72 hours.   Microbiology: No results found for this or any previous visit (from the past 720 hour(s)).  Medical History: Past Medical History:  Diagnosis Date  . Anxiety   . Bipolar 1 disorder (HCC)   . Bone disorder   . Depression   . GERD (gastroesophageal reflux disease)     Medications:  Prescriptions Prior to Admission  Medication Sig Dispense Refill Last Dose  . acetaminophen (TYLENOL) 500 MG tablet Take 500 mg by mouth 2 (two) times daily as needed.   prn at prn  . alendronate (FOSAMAX) 70 MG tablet Take 70 mg by mouth once a week. Take with a full glass of water on an empty stomach.   02/23/2016 at 0600  . ALPRAZolam (XANAX) 1 MG tablet Take 1 mg by mouth 2 (two) times daily as needed for anxiety.   02/26/2016 at 0800  . benztropine (COGENTIN) 1 MG tablet Take 1 mg by mouth at bedtime.   02/25/2016 at 2000  . chlorproMAZINE (THORAZINE) 50 MG tablet Take 50 mg by mouth 2 (two) times daily.   02/26/2016 at 0800  . escitalopram (LEXAPRO) 10 MG tablet Take 10 mg by mouth daily.   02/26/2016 at 0800  . haloperidol (HALDOL) 5 MG  tablet Take 5 mg by mouth 2 (two) times daily. 2nd dose at 1600   02/26/2016 at 0800  . levothyroxine (SYNTHROID, LEVOTHROID) 25 MCG tablet Take 25 mcg by mouth every morning.   02/26/2016 at 0700  . NAMZARIC 28-10 MG CP24 Take 1 tablet by mouth daily.   02/26/2016 at 0800  . Omega-3 Fatty Acids (FISH OIL) 1000 MG CPDR Take 2 capsules by mouth 2 (two) times daily.   02/26/2016 at 0800  . omeprazole (PRILOSEC) 40 MG capsule Take 40 mg by mouth daily.   02/26/2016 at 0700  . risperiDONE (RISPERDAL) 3 MG tablet Take 3 mg by mouth 2 (two) times daily.   02/26/2016 at 0800  . Vitamin D, Ergocalciferol, (DRISDOL) 50000 units CAPS capsule Take 50,000 Units by mouth every 7 (seven) days.   02/17/2016 at 0800   Assessment: CrCl = 69.6 ml/min  Goal of Therapy:  resolution of infection  Plan:  Zosyn 3.375 gm IV Q8H EI to start 9/7.   Emberley Kral D 02/26/2016,9:45 PM

## 2016-02-26 NOTE — ED Provider Notes (Signed)
[facility]  Department of Emergency Medicine   Code Blue CONSULT NOTE  Chief Complaint: Cardiac arrest/unresponsive   Level V Caveat: Unresponsive  History of present illness: I was contacted by the hospital for a CODE BLUE cardiac arrest upstairs and presented to the patient's bedside. Patient with reported asystolic event unresponsive. After   period unresponsiveness patient did have retain circulation with out any cardiac medications or defibrillation.  However the patient was acutely encephalopathic with agonal respirations.  At that point due to the patient's condition was determ in the patient's best interest to intubate the patient  ROS: Unable to obtain, Level V caveat  Scheduled Meds: . atorvastatin  40 mg Oral q1800  . benztropine  1 mg Oral QHS  . chlorproMAZINE  50 mg Oral BID  . [START ON 02/27/2016] memantine  28 mg Oral Daily   And  . [START ON 02/27/2016] donepezil  10 mg Oral Daily  . enoxaparin (LOVENOX) injection  40 mg Subcutaneous Q24H  . [START ON 02/27/2016] escitalopram  10 mg Oral Daily  . haloperidol  5 mg Oral BID  . [START ON 02/27/2016] levothyroxine  25 mcg Oral BH-q7a  . [START ON 02/27/2016] omega-3 acid ethyl esters  1 g Oral Daily  . [START ON 02/27/2016] pantoprazole  40 mg Oral Daily  . risperiDONE  3 mg Oral BID  . Vitamin D (Ergocalciferol)  50,000 Units Oral Q7 days   Continuous Infusions:  PRN Meds:.acetaminophen, ALPRAZolam, LORazepam Past Medical History:  Diagnosis Date  . Anxiety   . Bipolar 1 disorder (HCC)   . Bone disorder   . Depression   . GERD (gastroesophageal reflux disease)    History reviewed. No pertinent surgical history. Social History   Social History  . Marital status: Single    Spouse name: N/A  . Number of children: N/A  . Years of education: N/A   Occupational History  . Not on file.   Social History Main Topics  . Smoking status: Never Smoker  . Smokeless tobacco: Never Used  . Alcohol use No  .  Drug use: Unknown  . Sexual activity: Not on file   Other Topics Concern  . Not on file   Social History Narrative  . No narrative on file   No Known Allergies  Last set of Vital Signs (not current) Vitals:   02/26/16 1400 02/26/16 1555  BP: (!) 148/70 (!) 141/49  Pulse: 62 (!) 55  Resp: 10 16  Temp:  98.1 F (36.7 C)      Physical Exam  Gen: ritically ill-appearing with acute respiratory distress. Patientwith altered mental status with agonal respirations Cardiovascular: pulseless  Resp: apneic. Breath sounds equal bilaterally with bagging  Abd: nondistended  Neuro: GCS 5, not following commands minimal response to painful stimuli not protecting his airway HEENT: No blood in posterior pharynx, gag reflex absent  Neck: No crepitus  Musculoskeletal: No deformity  Skin: warm  Procedures  INTUBATION Performed by: Willy Eddy Required items: required blood products, implants, devices, and special equipment available Patient identity confirmed: provided demographic data and hospital-assigned identification number Time out: Immediately prior to procedure a "time out" was called to verify the correct patient, procedure, equipment, support staff and site/side marked as required. Indications: acute respiratory failure, agonal respiration Intubation method: Direct Preoxygenation: BVM Sedatives: etomidate 20mg  Paralytic: roccuronium 100mg  Tube Size: 7.5 cuffed Post-procedure assessment: chest rise and ETCO2 monitor Breath sounds: equal and absent over the epigastrium Tube secured by Respiratory Therapy  Patient tolerated the procedure well with no immediate complications.  CRITICAL CARE Performed by: Willy EddyPatrick Hilliary Jock Total critical care time: 30 Critical care time was exclusive of separately billable procedures and treating other patients. Critical care was necessary to treat or prevent imminent or life-threatening deterioration. Critical care was time spent  personally by me on the following activities: development of treatment plan with patient and/or surrogate as well as nursing, discussions with consultants, evaluation of patient's response to treatment, examination of patient, obtaining history from patient or surrogate, ordering and performing treatments and interventions, ordering and review of laboratory studies, ordering and review of radiographic studies, pulse oximetry and re-evaluation of patient's condition.     Medical Decision making  Patient with asystolic event and unresponsiveness followed by spontaneous return of circulation with persistent encephalopathy, decreased GCS and agonal respirations. Patient intubated for airway protection. Stat labs drawn. transferred to the ICU for further evaluation and management  Assessment and Plan  Transferred to ICU for further hemodynamic monitoring and evaluation of cardiac event.    Willy EddyPatrick Pieper Kasik, MD 02/26/16 2056

## 2016-02-26 NOTE — ED Notes (Signed)
Informed RN bed ready 

## 2016-02-26 NOTE — Consult Note (Signed)
PULMONARY / CRITICAL CARE MEDICINE   Name: Warren NeighborRalph M Ozimek Jr. MRN: [id number] DOB: 09/02/1950    ADMISSION DATE:  02/26/2016 CONSULTATION DATE:  02/26/16  REFERRING MD:  Dr. Elisabeth PigeonVachhani  CHIEF COMPLAINT: Post Cardiac arrest  HISTORY OF PRESENT ILLNESS:   Warren BienRalph [name] is 65 year old male group home resident with past medical history significant for anxiety, bipolar disorder, depression and GERD. Patient was brought to the ED earlier on 9/7 due to altered mental status. Patient was worked up for stroke/infection. Apparently patient went for an echo and came back. Central telemetry notified patient to be in asystole on monitor. Nurse found the patient  unarousable. Patient received sternal rub and his heart rate was up to 84 without any cardiac medications or defibrillation. He also had left-sided facial and tongue deviation which was a change from earlier. Patient was found to be grey with agonal respiration therefore ED physician decided to intubate the patient. [facility] to assume further care.   ast medical history of Anxiety; Bipolar 1 disorder (HCC); Bone disorder; Depression; and GERD (gastroesophageal reflux disease).  PAST SURGICAL HISTORY: He  has no past surgical history on file.  No Known Allergies  No current facility-administered medications on file prior to encounter.    No current outpatient prescriptions on file prior to encounter.    FAMILY HISTORY:  His has no family status information on file.    SOCIAL HISTORY: He  reports that he has never smoked. He has never used smokeless tobacco. He reports that he does not drink alcohol.  REVIEW OF SYSTEMS:   Unable to obtain due to the patient's condition   SUBJECTIVE:  Unable to obtain due to the patient's condition   VITAL SIGNS: BP (!) 141/76 (BP Location: Right Arm)   Pulse 80   Temp 97.6 F (36.4 C) (Axillary)   Resp 15   Ht 6' (1.829 m)   Wt 65.5 kg (144 lb 8 oz)   SpO2 100%   BMI 19.60 kg/m    HEMODYNAMICS:    VENTILATOR SETTINGS: Vent Mode: PRVC FiO2 (%):  [50 %] 50 % Set Rate:  [16 bmp] 16 bmp Vt Set:  [450 mL] 450 mL PEEP:  [5 cmH20] 5 cmH20  INTAKE / OUTPUT: No intake/output data recorded.  PHYSICAL EXAMINATION: General:  sickly appearing male, intubated and mechanically ventilated Neuro:  does not follow any commands HEENT:   atraumatic normocephalic, pinpoint pupils, no JVD noted Cardiovascular: S1-S2, RRR, no MRG noted Lungs:  Clear bilaterally, no wheezes, crackles, rhonchi noted Abdomen:  Flat. Nontender,active bowel sounds Musculoskeletal:  No inflammation/deformity noted Skin:  Grossly intact LABS:  BMET  Recent Labs Lab 02/26/16 1032  NA 145  K 4.9  CL 110  CO2 30  BUN 33*  CREATININE 0.98  GLUCOSE 89    Electrolytes  Recent Labs Lab 02/26/16 1032 02/26/16 2140  CALCIUM 9.3  --   MG  --  1.8    CBC  Recent Labs Lab 02/26/16 1032  WBC 9.6  HGB 12.3*  HCT 35.3*  PLT 145*    Coag's No results for input(s): APTT, INR in the last 168 hours.  Sepsis Markers No results for input(s): LATICACIDVEN, PROCALCITON, O2SATVEN in the last 168 hours.  ABG No results for input(s): PHART, PCO2ART, PO2ART in the last 168 hours.  Liver Enzymes  Recent Labs Lab 02/26/16 1032  AST 29  ALT 20  ALKPHOS 87  BILITOT 1.2  ALBUMIN 3.6  Cardiac Enzymes  Recent Labs Lab 02/26/16 1032 02/26/16 2123  TROPONINI <0.03 <0.03    Glucose  Recent Labs Lab 02/26/16 2107  GLUCAP 113*    Imaging Ct Head Wo Contrast  Result Date: 02/26/2016 CLINICAL DATA:  Altered mental status. Upon awakening this am the pt was able to sit up on the side of the bed and seemed okay but when called breakfast he could not come. EXAM: CT HEAD WITHOUT CONTRAST TECHNIQUE: Contiguous axial images were obtained from the base of the skull through the vertex without intravenous contrast. COMPARISON:  10/31/2015, 05/20/2015 FINDINGS: Brain: No evidence of  acute infarction, hemorrhage, hydrocephalus, extra-axial collection or mass lesion/mass effect. There is generalized cerebral atrophy. There is mild periventricular white matter low attenuation likely secondary to microvascular disease. Vascular: No hyperdense vessel or unexpected calcification. Skull: No osseous abnormality. Sinuses/Orbits: Visualized paranasal sinuses are clear. Visualized mastoid sinuses are clear. Visualized orbits demonstrate no focal abnormality. Other: None IMPRESSION: 1. No acute intracranial pathology. Electronically Signed   By: Elige Ko   On: 02/26/2016 11:07   Dg Chest Port 1 View  Result Date: 02/26/2016 CLINICAL DATA:  Code blue, intubated EXAM: PORTABLE CHEST 1 VIEW COMPARISON:  02/21/2016 FINDINGS: Cardiomediastinal silhouette is stable. No acute infiltrate or pulmonary edema. Endotracheal tube in place with tip 2.5 cm above the carina. NG tube in place with tip in proximal stomach. There is no pneumothorax. Degenerative changes are noted left shoulder. IMPRESSION: No active disease. Endotracheal and NG tube in place. No pneumothorax. Electronically Signed   By: Natasha Mead M.D.   On: 02/26/2016 21:29   Dg Chest Port 1 View  Result Date: 02/26/2016 CLINICAL DATA:  Decreased activity level. Decreased communication, not walking like normal. EXAM: PORTABLE CHEST 1 VIEW COMPARISON:  Portable chest x-ray of September 16, 2015 FINDINGS: The lungs are adequately inflated and clear. The heart and pulmonary vascularity are normal. The mediastinum is normal in width. There is no pleural effusion. There is calcification in the wall of the aortic arch. There is considerable degenerative change of both shoulders with chronic inferior subluxation of the right humeral head with respect to the glenoid. IMPRESSION: There is no active cardiopulmonary disease. Aortic atherosclerosis. Chronic changes of both shoulders. Electronically Signed   By: David  [place] M.D.   On: 02/26/2016 14:29      STUDIES:  9/7 CT head WO contrast>>No findings of acute CVA,stigmata of recent global anoxic event, intracranial hemorrhage, ormass lesion identified.   CULTURES: 9/7 urine culture>>  ANTIBIOTICS: 9/7 Zosyn>>  SIGNIFICANT EVENTS: 9/7 patient admitted to the ICU post cardiac arrest. Intubated and mechanically ventilated. LINES/TUBES: none  DISCUSSION: 65 year old male came with altered mental status. Was in Asystole,momentarily, ROSC . HR was in 84. Intubated for agonal respirations. Now seizing. Loaded with [facility] consulted.  ASSESSMENT / PLAN:  PULMONARY A: Acute respiratory failure P:   Continue vent support Fentanyl/Versed Routine ABGs CXR in a.m.  CARDIOVASCULAR A:  PEA Cardiac arrest Hyperlipidemia P:  continuous telemetry Keep MAP goals>65 Trend troponins Continue lipitor Stat EKG  RENAL A:   No active issues Na High normal P:   Replace electrolytes per ICU protocol D5W  GASTROINTESTINAL A:   Hx of GERD P:   Continue Pepcid  HEMATOLOGIC A:   No active issues P:  lovenox for DVT prophylaxis  INFECTIOUS A:   No active issues P:   Zosyn   ENDOCRINE A:   NO Active issues P:   Blood sugar checks with BMP  NEUROLOGIC A:   Acute encephalopathy History of bipolar disorder History of depression P:   RASS goal:0 to -1 Lorazepam PRN Loaded with [facility] consulted   FAMILY  - Updates: Care giver was updated about the status of patient's condition.  Want him to be full code at this time.     Dorien Bessent,AG-ACNP Pulmonary and Critical Care Medicine [facility]   02/26/2016, 10:36 PM

## 2016-02-26 NOTE — Care Management (Signed)
Patient presents to ED with altered mental status.  He is able to state his name but unable to state the name of the facility where he resides. [facility]  The phone number  listed as "home" has a voice mailbox that is full.  There was an answer on the mobile phone but person that answered the phone, would not provide his name or identify as to whether he was an employee of the home.  Due to a potential HIPPA violation this CM would not provide any PHI information or ask questions.  The staff person hung up on CM.  CM tried to call again and call was not answered and unable to leave a voicemail.  This person called back and  using a loud voice stated there was a worker present that can answer any questions.   Discussed there had not been anyone in the room when CM attempted to discuss. Said he had spoken with the nurses in the ED and "they all say there is no reason for you to be calling me." Explained the role of the CM.    On third attempt to assess, the worker was present.  Patient does not have a HCPOA.  The owner of the group home "[name]" and patient usually  make decisions.  They have been doing this for years.  Patient is usually very talkative and independent with ambulation.  He can not stand today and "not saying much."  Will be placed in observation

## 2016-02-26 NOTE — Progress Notes (Signed)
Noticed erratic pattern on telemetry monitor and went to check on patient.  Pt was unresponsive and head was shaking.  Sternal rub did not produce an immediate response, so Code Blue was issued.  Pt was intubated on the unit and patient was transferred to the unit.

## 2016-02-26 NOTE — H&P (Signed)
[facility] - [place] [facility]   PATIENT NAME: Warren [name]    MR#:  [id number]  DATE OF BIRTH:  Oct 14, 1950  DATE OF ADMISSION:  02/26/2016  PRIMARY CARE PHYSICIAN: Evelene Croon, MD   REQUESTING/REFERRING PHYSICIAN: Lord  CHIEF COMPLAINT:   Chief Complaint  Patient presents with  . Altered Mental Status  . Dysuria    HISTORY OF PRESENT ILLNESS: Quintin Hjort  is a 65 y.o. male with a known history of Anxiety, bipolar disorder, depression, gastric surgery for disease- lives in a group home for last 17 years without any visits from family. The owner of the group home is his primary caretaker and decision maker. He was sent to emergency room by his caretaker because he is not himself today. Normally he is able to walk without any support he is very talkative, today he was noted confused and not talking and just keep staring. He also only walk one to 2 steps this morning and remains seated in his chair which is very unusual for him. In ER his urine was checked which is negative his white blood cell count is not high and CT scan of the head is negative for any acute findings. But because of his concern of altered mental status is given as admission for further workup for stroke or infective etiology. All history obtained from his caretaker and owner of the group home.  PAST MEDICAL HISTORY:   Past Medical History:  Diagnosis Date  . Anxiety   . Bipolar 1 disorder (HCC)   . Bone disorder   . Depression   . GERD (gastroesophageal reflux disease)     PAST SURGICAL HISTORY: No past surgical history on file.  SOCIAL HISTORY:  Social History  Substance Use Topics  . Smoking status: Never Smoker  . Smokeless tobacco: Not on file  . Alcohol use No    FAMILY HISTORY:  Family History  Problem Relation Age of Onset  . Family history unknown: Yes    DRUG ALLERGIES: No Known Allergies  REVIEW OF SYSTEMS:   Because of change in mental status patient is not giving  me review of system.  MEDICATIONS AT HOME:  Prior to Admission medications   Medication Sig Start Date End Date Taking? Authorizing Provider  acetaminophen (TYLENOL) 500 MG tablet Take 500 mg by mouth 2 (two) times daily as needed.   Yes Historical Provider, MD  alendronate (FOSAMAX) 70 MG tablet Take 70 mg by mouth once a week. Take with a full glass of water on an empty stomach.   Yes Historical Provider, MD  ALPRAZolam Prudy Feeler) 1 MG tablet Take 1 mg by mouth 2 (two) times daily as needed for anxiety.   Yes Historical Provider, MD  benztropine (COGENTIN) 1 MG tablet Take 1 mg by mouth at bedtime.   Yes Historical Provider, MD  chlorproMAZINE (THORAZINE) 50 MG tablet Take 50 mg by mouth 2 (two) times daily.   Yes Historical Provider, MD  escitalopram (LEXAPRO) 10 MG tablet Take 10 mg by mouth daily.   Yes Historical Provider, MD  haloperidol (HALDOL) 5 MG tablet Take 5 mg by mouth 2 (two) times daily. 2nd dose at 1600   Yes Historical Provider, MD  levothyroxine (SYNTHROID, LEVOTHROID) 25 MCG tablet Take 25 mcg by mouth every morning.   Yes Historical Provider, MD  NAMZARIC 28-10 MG CP24 Take 1 tablet by mouth daily. 02/21/16  Yes Historical Provider, MD  Omega-3 Fatty Acids (FISH OIL) 1000 MG CPDR Take 2  capsules by mouth 2 (two) times daily.   Yes Historical Provider, MD  omeprazole (PRILOSEC) 40 MG capsule Take 40 mg by mouth daily.   Yes Historical Provider, MD  risperiDONE (RISPERDAL) 3 MG tablet Take 3 mg by mouth 2 (two) times daily.   Yes Historical Provider, MD  Vitamin D, Ergocalciferol, (DRISDOL) 50000 units CAPS capsule Take 50,000 Units by mouth every 7 (seven) days.   Yes Historical Provider, MD      PHYSICAL EXAMINATION:   VITAL SIGNS: Blood pressure (!) 151/73, pulse (!) 57, temperature 98.2 F (36.8 C), temperature source Oral, resp. rate 13, height 6' (1.829 m), weight 74.8 kg (165 lb), SpO2 100 %.  GENERAL:  65 y.o.-year-old patient lying in the bed with no acute distress.   EYES: Pupils equal, round, reactive to light and accommodation. No scleral icterus. Extraocular muscles intact.  HEENT: Head atraumatic, normocephalic. Oropharynx and nasopharynx clear.  NECK:  Supple, no jugular venous distention. No thyroid enlargement, no tenderness.  LUNGS: Normal breath sounds bilaterally, no wheezing, rales,rhonchi or crepitation. No use of accessory muscles of respiration.  CARDIOVASCULAR: S1, S2 normal. No murmurs, rubs, or gallops.  ABDOMEN: Soft, nontender, nondistended. Bowel sounds present. No organomegaly or mass.  EXTREMITIES: No pedal edema, cyanosis, or clubbing.  NEUROLOGIC: Cranial nerves II through XII are intact. Muscle strength 5/5 in all extremities. Sensation intact. Gait not checked. He is not talking but he is following simple commands. PSYCHIATRIC: The patient is alert and not talking so unable to check his orientation.  SKIN: No obvious rash, lesion, or ulcer.   LABORATORY PANEL:   CBC  Recent Labs Lab 02/26/16 1032  WBC 9.6  HGB 12.3*  HCT 35.3*  PLT 145*  MCV 98.3  MCH 34.2*  MCHC 34.8  RDW 13.2  LYMPHSABS 1.3  MONOABS 0.6  EOSABS 0.1  BASOSABS 0.1   ------------------------------------------------------------------------------------------------------------------  Chemistries   Recent Labs Lab 02/26/16 1032  NA 145  K 4.9  CL 110  CO2 30  GLUCOSE 89  BUN 33*  CREATININE 0.98  CALCIUM 9.3  AST 29  ALT 20  ALKPHOS 87  BILITOT 1.2   ------------------------------------------------------------------------------------------------------------------ estimated creatinine clearance is 79.5 mL/min (by C-G formula based on SCr of 0.98 mg/dL). ------------------------------------------------------------------------------------------------------------------ No results for input(s): TSH, T4TOTAL, T3FREE, THYROIDAB in the last 72 hours.  Invalid input(s): FREET3   Coagulation profile No results for input(s): INR, PROTIME in  the last 168 hours. ------------------------------------------------------------------------------------------------------------------- No results for input(s): DDIMER in the last 72 hours. -------------------------------------------------------------------------------------------------------------------  Cardiac Enzymes  Recent Labs Lab 02/26/16 1032  TROPONINI <0.03   ------------------------------------------------------------------------------------------------------------------ Invalid input(s): POCBNP  ---------------------------------------------------------------------------------------------------------------  Urinalysis    Component Value Date/Time   COLORURINE AMBER (A) 02/26/2016 1210   APPEARANCEUR HAZY (A) 02/26/2016 1210   LABSPEC 1.021 02/26/2016 1210   PHURINE 6.0 02/26/2016 1210   GLUCOSEU NEGATIVE 02/26/2016 1210   HGBUR NEGATIVE 02/26/2016 1210   BILIRUBINUR NEGATIVE 02/26/2016 1210   KETONESUR TRACE (A) 02/26/2016 1210   PROTEINUR 30 (A) 02/26/2016 1210   NITRITE NEGATIVE 02/26/2016 1210   LEUKOCYTESUR NEGATIVE 02/26/2016 1210     RADIOLOGY: Ct Head Wo Contrast  Result Date: 02/26/2016 CLINICAL DATA:  Altered mental status. Upon awakening this am the pt was able to sit up on the side of the bed and seemed okay but when called breakfast he could not come. EXAM: CT HEAD WITHOUT CONTRAST TECHNIQUE: Contiguous axial images were obtained from the base of the skull through the vertex without intravenous contrast. COMPARISON:  10/31/2015, 05/20/2015 FINDINGS: Brain: No evidence of acute infarction, hemorrhage, hydrocephalus, extra-axial collection or mass lesion/mass effect. There is generalized cerebral atrophy. There is mild periventricular white matter low attenuation likely secondary to microvascular disease. Vascular: No hyperdense vessel or unexpected calcification. Skull: No osseous abnormality. Sinuses/Orbits: Visualized paranasal sinuses are clear.  Visualized mastoid sinuses are clear. Visualized orbits demonstrate no focal abnormality. Other: None IMPRESSION: 1. No acute intracranial pathology. Electronically Signed   By: Elige KoHetal  Patel   On: 02/26/2016 11:07   Dg Chest Port 1 View  Result Date: 02/26/2016 CLINICAL DATA:  Decreased activity level. Decreased communication, not walking like normal. EXAM: PORTABLE CHEST 1 VIEW COMPARISON:  Portable chest x-ray of September 16, 2015 FINDINGS: The lungs are adequately inflated and clear. The heart and pulmonary vascularity are normal. The mediastinum is normal in width. There is no pleural effusion. There is calcification in the wall of the aortic arch. There is considerable degenerative change of both shoulders with chronic inferior subluxation of the right humeral head with respect to the glenoid. IMPRESSION: There is no active cardiopulmonary disease. Aortic atherosclerosis. Chronic changes of both shoulders. Electronically Signed   By: David  [place] M.D.   On: 02/26/2016 14:29    EKG: Orders placed or performed during the hospital encounter of 02/26/16  . ED EKG  . ED EKG  . EKG 12-Lead  . EKG 12-Lead    IMPRESSION AND PLAN:  * Altered mental status   No source of infection yet, but chest x-ray is ordered but not done yet.   We will observe and do workup and to rule out any stroke.   MRI on the brain, echocardiogram and carotid Doppler study.   Neuro checks and physical therapy evaluation.  * Anxiety and bipolar disorder   Continue baseline home medications.   All the records are reviewed and case discussed with ED provider. Management plans discussed with the patient, family and they are in agreement.  CODE STATUS: Full code Code Status History    This patient does not have a recorded code status. Please follow your organizational policy for patients in this situation.     Patient's finding and plan discussed with his primary caretaker and decision-maker is at the group  home.  TOTAL TIME TAKING CARE OF THIS PATIENT: 50 minutes.    Altamese DillingVACHHANI, Cyntha Brickman M.D on 02/26/2016   Between 7am to 6pm - Pager - [phone]  After 6pm go to [email or web address] - password [email or web address]  [facility]  Office  [phone]  CC: Primary care physician; Evelene CroonNIEMEYER, MEINDERT, MD   Note: This dictation was prepared with Dragon dictation along with smaller phrase technology. Any transcriptional errors that result from this process are unintentional.

## 2016-02-26 NOTE — ED Triage Notes (Signed)
Pt present with assisted living staff - staff reports that the pt was last seen well last pm at approx 2200 when the pt went to bed  Upon awakening this am the pt was able to sit up on the side of the bed and seemed okay but when called for breakfast he did not come  Pt had been incontinent of urine at that time and one time during the night  Staff reports odorous urine   Altered mental status present

## 2016-02-26 NOTE — Care Management Obs Status (Signed)
MEDICARE OBSERVATION STATUS NOTIFICATION   Patient Details  Name: Warren NeighborRalph M Fancher Jr. MRN: [id number] Date of Birth: 05/26/1951   Medicare Observation Status Notification Given:  Yes Warren [name] sign notice for patient.   Eber HongGreene, Keeli Roberg R, RN 02/26/2016, 2:48 PM

## 2016-02-26 NOTE — Plan of Care (Signed)
Spoke with Dr. Elisabeth PigeonVachhani regarding patient status after returning from ECHO. Informed MD central telemetry notified of patient asystole on monitor and unarousable and RN's assessing patient and able to arouse patient and returned HR 84. Also, notified MD change of status regarding left side facial and tongue deviation, which was a change from earlier in shift. Received order for Ativan and Neuro consult for AM. No further orders received. Update given to nightshift RN.

## 2016-02-26 NOTE — ED Notes (Signed)
Pt taken to CT at this time.

## 2016-02-26 NOTE — ED Notes (Signed)
Report given to Susan, RN

## 2016-02-26 NOTE — Progress Notes (Signed)
Patient ID: Warren NeighborRalph M Seats Jr., male   DOB: 12/19/1950, 65 y.o.   MRN: [id number]  [facility] PROGRESS NOTE  Warren Neighboralph M Mckenzie Jr. [id number] DOB: 08/31/1950 DOA: 02/26/2016 PCP: Evelene CroonNIEMEYER, MEINDERT, MD  HPI/Subjective: Responded to code blue. When I arrive with ER PHysician, the patient had a pulse and was grey, opened eyes to sternal rub and did not talk.  Objective: Vitals:   02/26/16 1400 02/26/16 1555  BP: (!) 148/70 (!) 141/49  Pulse: 62 (!) 55  Resp: 10 16  Temp:  98.1 F (36.7 C)    Filed Weights   02/26/16 0946 02/26/16 1555  Weight: 74.8 kg (165 lb) 65.5 kg (144 lb 8 oz)    ROS: Review of Systems  Unable to perform ROS: Acuity of condition   Exam: Physical Exam  Eyes: Conjunctivae and lids are normal. Pupils are equal, round, and reactive to light.  Neck: Carotid bruit is not present.  Cardiovascular: S1 normal, S2 normal and normal heart sounds.   Pulses:      Dorsalis pedis pulses are 1+ on the right side, and 1+ on the left side.  Respiratory: Accessory muscle usage present. He has decreased breath sounds in the right middle field, the right lower field, the left middle field and the left lower field. He has no wheezes. He has no rhonchi. He has no rales.  GI: Soft. Bowel sounds are normal. There is no tenderness.  Musculoskeletal:       Right ankle: He exhibits no swelling.       Left ankle: He exhibits no swelling.  Neurological:  Unresponsive to sternal rub  Skin:  Grey looking skin  Psychiatric:  Unresponsive      Data Reviewed: Basic Metabolic Panel:  Recent Labs Lab 02/26/16 1032  NA 145  K 4.9  CL 110  CO2 30  GLUCOSE 89  BUN 33*  CREATININE 0.98  CALCIUM 9.3   Liver Function Tests:  Recent Labs Lab 02/26/16 1032  AST 29  ALT 20  ALKPHOS 87  BILITOT 1.2  PROT 6.6  ALBUMIN 3.6   CBC:  Recent Labs Lab 02/26/16 1032  WBC 9.6  NEUTROABS 7.6*  HGB 12.3*  HCT 35.3*  MCV 98.3  PLT 145*   Cardiac Enzymes:  Recent  Labs Lab 02/26/16 1032  TROPONINI <0.03    Studies: Ct Head Wo Contrast  Result Date: 02/26/2016 CLINICAL DATA:  Altered mental status. Upon awakening this am the pt was able to sit up on the side of the bed and seemed okay but when called breakfast he could not come. EXAM: CT HEAD WITHOUT CONTRAST TECHNIQUE: Contiguous axial images were obtained from the base of the skull through the vertex without intravenous contrast. COMPARISON:  10/31/2015, 05/20/2015 FINDINGS: Brain: No evidence of acute infarction, hemorrhage, hydrocephalus, extra-axial collection or mass lesion/mass effect. There is generalized cerebral atrophy. There is mild periventricular white matter low attenuation likely secondary to microvascular disease. Vascular: No hyperdense vessel or unexpected calcification. Skull: No osseous abnormality. Sinuses/Orbits: Visualized paranasal sinuses are clear. Visualized mastoid sinuses are clear. Visualized orbits demonstrate no focal abnormality. Other: None IMPRESSION: 1. No acute intracranial pathology. Electronically Signed   By: Elige KoHetal  Patel   On: 02/26/2016 11:07   Dg Chest Port 1 View  Result Date: 02/26/2016 CLINICAL DATA:  Decreased activity level. Decreased communication, not walking like normal. EXAM: PORTABLE CHEST 1 VIEW COMPARISON:  Portable chest x-ray of September 16, 2015 FINDINGS: The lungs are adequately inflated and clear. The  heart and pulmonary vascularity are normal. The mediastinum is normal in width. There is no pleural effusion. There is calcification in the wall of the aortic arch. There is considerable degenerative change of both shoulders with chronic inferior subluxation of the right humeral head with respect to the glenoid. IMPRESSION: There is no active cardiopulmonary disease. Aortic atherosclerosis. Chronic changes of both shoulders. Electronically Signed   By: David  [place] M.D.   On: 02/26/2016 14:29    Scheduled Meds: . atorvastatin  40 mg Oral q1800  .  benztropine  1 mg Oral QHS  . chlorproMAZINE  50 mg Oral BID  . [START ON 02/27/2016] memantine  28 mg Oral Daily   And  . [START ON 02/27/2016] donepezil  10 mg Oral Daily  . enoxaparin (LOVENOX) injection  40 mg Subcutaneous Q24H  . [START ON 02/27/2016] escitalopram  10 mg Oral Daily  . haloperidol  5 mg Oral BID  . [START ON 02/27/2016] levothyroxine  25 mcg Oral BH-q7a  . [START ON 02/27/2016] omega-3 acid ethyl esters  1 g Oral Daily  . [START ON 02/27/2016] pantoprazole  40 mg Oral Daily  . risperiDONE  3 mg Oral BID  . Vitamin D (Ergocalciferol)  50,000 Units Oral Q7 days    Assessment/Plan:  1. Responded to code blue.  Nursing staff got pulse after sternal rub. Since patient was grey and labored breathing and unresponsive, decision was made to intubate and transfer to ICU 13. Patient intubated by ER Physician.  I spoke with [facility] and they will call Dr Belia Heman to assume care secondary to intubation and acute respiratory failure.   Code Status:     Code Status Orders        Start     Ordered   02/26/16 1646  Full code  Continuous     02/26/16 1645    Code Status History    Date Active Date Inactive Code Status Order ID Comments User Context   02/26/2016  4:45 PM 02/26/2016  6:10 PM Full Code [id number]  Warren Dilling, MD Inpatient     Disposition Plan: Transfer to critical care service  Consultants:  Consulted critical care service  Time spent: 30 minutes critical care time    Alford Highland  [employer]

## 2016-02-26 NOTE — ED Notes (Signed)
Pt given ginger ale to drink. 

## 2016-02-26 NOTE — Progress Notes (Signed)
[name] Physician-Brief Progress Note Patient Name: Eleanora NeighborRalph M Hiser Jr. DOB: 04/10/1951 MRN: [id number]  Cam care at reqwuest of APP  Patient seems to be seizing  Plan Advised app to call neuro and consider dilantin v keppra    Intervention Category Major Interventions: Other:  Karrina Lye 02/26/2016, 11:30 PM

## 2016-02-27 ENCOUNTER — Inpatient Hospital Stay: Payer: Medicare Other

## 2016-02-27 ENCOUNTER — Inpatient Hospital Stay (HOSPITAL_COMMUNITY): Payer: Medicare Other

## 2016-02-27 DIAGNOSIS — R4020 Unspecified coma: Secondary | ICD-10-CM

## 2016-02-27 DIAGNOSIS — R259 Unspecified abnormal involuntary movements: Secondary | ICD-10-CM

## 2016-02-27 DIAGNOSIS — J9601 Acute respiratory failure with hypoxia: Secondary | ICD-10-CM

## 2016-02-27 LAB — CBC WITH DIFFERENTIAL/PLATELET
BASOS PCT: 2 %
Basophils Absolute: 0.2 10*3/uL — ABNORMAL HIGH (ref 0–0.1)
EOS ABS: 0.1 10*3/uL (ref 0–0.7)
Eosinophils Relative: 1 %
HCT: 33.1 % — ABNORMAL LOW (ref 40.0–52.0)
HEMOGLOBIN: 11.5 g/dL — AB (ref 13.0–18.0)
Lymphocytes Relative: 12 %
Lymphs Abs: 1.3 10*3/uL (ref 1.0–3.6)
MCH: 34.2 pg — ABNORMAL HIGH (ref 26.0–34.0)
MCHC: 34.8 g/dL (ref 32.0–36.0)
MCV: 98.2 fL (ref 80.0–100.0)
Monocytes Absolute: 0.7 10*3/uL (ref 0.2–1.0)
Monocytes Relative: 7 %
NEUTROS PCT: 78 %
Neutro Abs: 8.3 10*3/uL — ABNORMAL HIGH (ref 1.4–6.5)
Platelets: 136 10*3/uL — ABNORMAL LOW (ref 150–440)
RBC: 3.37 MIL/uL — AB (ref 4.40–5.90)
RDW: 12.6 % (ref 11.5–14.5)
WBC: 10.7 10*3/uL — AB (ref 3.8–10.6)

## 2016-02-27 LAB — TROPONIN I
TROPONIN I: 0.03 ng/mL — AB (ref <0.03)
TROPONIN I: 0.03 ng/mL — AB (ref <0.03)
Troponin I: <0.03 ng/mL (ref <0.03)

## 2016-02-27 LAB — PROTIME-INR
INR: 1.12
Prothrombin Time: 14.5 seconds (ref 11.4–15.2)

## 2016-02-27 LAB — BASIC METABOLIC PANEL
ANION GAP: 8 (ref 5–15)
BUN: 26 mg/dL — ABNORMAL HIGH (ref 6–20)
CALCIUM: 8.7 mg/dL — AB (ref 8.9–10.3)
CO2: 22 mmol/L (ref 22–32)
Chloride: 113 mmol/L — ABNORMAL HIGH (ref 101–111)
Creatinine, Ser: 0.89 mg/dL (ref 0.61–1.24)
GFR calc Af Amer: >60 mL/min (ref >60)
GFR calc non Af Amer: >60 mL/min (ref >60)
Glucose, Bld: 102 mg/dL — ABNORMAL HIGH (ref 65–99)
Potassium: 4.4 mmol/L (ref 3.5–5.1)
Sodium: 143 mmol/L (ref 135–145)

## 2016-02-27 LAB — ECHOCARDIOGRAM COMPLETE
Height: 72 in
Weight: 2312 oz

## 2016-02-27 LAB — TRIGLYCERIDES: Triglycerides: 76 mg/dL (ref <150)

## 2016-02-27 LAB — BLOOD GAS, ARTERIAL
ACID-BASE EXCESS: 6.1 mmol/L — AB (ref 0.0–2.0)
BICARBONATE: 29.2 mmol/L — AB (ref 20.0–28.0)
FIO2: 0.5
O2 SAT: 99.9 %
PH ART: 7.53 — AB (ref 7.350–7.450)
Patient temperature: 37
pCO2 arterial: 35 mmHg (ref 32.0–48.0)
pO2, Arterial: 256 mmHg — ABNORMAL HIGH (ref 83.0–108.0)

## 2016-02-27 LAB — PHOSPHORUS
PHOSPHORUS: 1.5 mg/dL — AB (ref 2.5–4.6)
PHOSPHORUS: 2.4 mg/dL — AB (ref 2.5–4.6)
PHOSPHORUS: 3.4 mg/dL (ref 2.5–4.6)

## 2016-02-27 LAB — MAGNESIUM
Magnesium: 1.8 mg/dL (ref 1.7–2.4)
Magnesium: 1.9 mg/dL (ref 1.7–2.4)

## 2016-02-27 LAB — HEPARIN LEVEL (UNFRACTIONATED): Heparin Unfractionated: 1.02 IU/mL — ABNORMAL HIGH (ref 0.30–0.70)

## 2016-02-27 LAB — URINE CULTURE

## 2016-02-27 LAB — GLUCOSE, CAPILLARY
GLUCOSE-CAPILLARY: 100 mg/dL — AB (ref 65–99)
Glucose-Capillary: 94 mg/dL (ref 65–99)

## 2016-02-27 LAB — APTT: aPTT: 40 seconds — ABNORMAL HIGH (ref 24–36)

## 2016-02-27 MED ORDER — LORAZEPAM 2 MG/ML IJ SOLN
1.0000 mg | Freq: Once | INTRAMUSCULAR | Status: AC
  Administered 2016-02-27: 1 mg via INTRAVENOUS

## 2016-02-27 MED ORDER — MAGNESIUM SULFATE IN D5W 1-5 GM/100ML-% IV SOLN
1.0000 g | Freq: Once | INTRAVENOUS | Status: AC
  Administered 2016-02-28: 1 g via INTRAVENOUS
  Filled 2016-02-27: qty 100

## 2016-02-27 MED ORDER — HEPARIN (PORCINE) IN NACL 100-0.45 UNIT/ML-% IJ SOLN
1100.0000 [IU]/h | INTRAMUSCULAR | Status: DC
  Administered 2016-02-27: 1100 [IU]/h via INTRAVENOUS
  Filled 2016-02-27: qty 250

## 2016-02-27 MED ORDER — PROPOFOL 1000 MG/100ML IV EMUL
INTRAVENOUS | Status: AC
  Administered 2016-02-27: 20 ug/kg/min via INTRAVENOUS
  Filled 2016-02-27: qty 100

## 2016-02-27 MED ORDER — ORAL CARE MOUTH RINSE
15.0000 mL | OROMUCOSAL | Status: DC
  Administered 2016-02-27 – 2016-03-02 (×25): 15 mL via OROMUCOSAL

## 2016-02-27 MED ORDER — VITAL HIGH PROTEIN PO LIQD
1000.0000 mL | ORAL | Status: DC
  Administered 2016-02-27: 1000 mL

## 2016-02-27 MED ORDER — HEPARIN (PORCINE) IN NACL 100-0.45 UNIT/ML-% IJ SOLN
1050.0000 [IU]/h | INTRAMUSCULAR | Status: DC
  Administered 2016-02-28: 900 [IU]/h via INTRAVENOUS
  Administered 2016-02-29 – 2016-03-01 (×2): 850 [IU]/h via INTRAVENOUS
  Administered 2016-03-02: 1050 [IU]/h via INTRAVENOUS
  Filled 2016-02-27 (×4): qty 250

## 2016-02-27 MED ORDER — SODIUM CHLORIDE 0.9 % IV BOLUS (SEPSIS): 1000.0000 mL | Freq: Once | INTRAVENOUS | Status: DC

## 2016-02-27 MED ORDER — HEPARIN BOLUS VIA INFUSION
3900.0000 [IU] | Freq: Once | INTRAVENOUS | Status: AC
Start: 2016-02-27 — End: 2016-02-27
  Administered 2016-02-27: 3900 [IU] via INTRAVENOUS
  Filled 2016-02-27: qty 3900

## 2016-02-27 MED ORDER — PROPOFOL 1000 MG/100ML IV EMUL
5.0000 ug/kg/min | INTRAVENOUS | Status: DC
  Administered 2016-02-27: 20 ug/kg/min via INTRAVENOUS

## 2016-02-27 MED ORDER — SODIUM PHOSPHATES 45 MMOLE/15ML IV SOLN
10.0000 mmol | Freq: Once | INTRAVENOUS | Status: AC
  Administered 2016-02-27: 10 mmol via INTRAVENOUS
  Filled 2016-02-27: qty 3.33

## 2016-02-27 MED ORDER — FENTANYL CITRATE (PF) 100 MCG/2ML IJ SOLN
50.0000 ug | INTRAMUSCULAR | Status: DC | PRN
  Administered 2016-02-27: 50 ug via INTRAVENOUS

## 2016-02-27 NOTE — Progress Notes (Signed)
Warren [name]. is a 65 y.o. male  [id number]  Primary Cardiologist: Adrian Blackwater  Reason for Consultation: Bradycardia  HPI:65 YOWM admitted with AMS and coded and had to be intubated. He is unresponsive and HR is 47-55. Asked to evaluate.   Review of Systems: unable to obtain   Past Medical History:  Diagnosis Date  . Anxiety   . Bipolar 1 disorder (HCC)   . Bone disorder   . Depression   . GERD (gastroesophageal reflux disease)     Medications Prior to Admission  Medication Sig Dispense Refill  . acetaminophen (TYLENOL) 500 MG tablet Take 500 mg by mouth 2 (two) times daily as needed.    Marland Kitchen alendronate (FOSAMAX) 70 MG tablet Take 70 mg by mouth once a week. Take with a full glass of water on an empty stomach.    . ALPRAZolam (XANAX) 1 MG tablet Take 1 mg by mouth 2 (two) times daily as needed for anxiety.    . benztropine (COGENTIN) 1 MG tablet Take 1 mg by mouth at bedtime.    . chlorproMAZINE (THORAZINE) 50 MG tablet Take 50 mg by mouth 2 (two) times daily.    Marland Kitchen escitalopram (LEXAPRO) 10 MG tablet Take 10 mg by mouth daily.    . haloperidol (HALDOL) 5 MG tablet Take 5 mg by mouth 2 (two) times daily. 2nd dose at 1600    . levothyroxine (SYNTHROID, LEVOTHROID) 25 MCG tablet Take 25 mcg by mouth every morning.    Marland Kitchen NAMZARIC 28-10 MG CP24 Take 1 tablet by mouth daily.    . Omega-3 Fatty Acids (FISH OIL) 1000 MG CPDR Take 2 capsules by mouth 2 (two) times daily.    Marland Kitchen omeprazole (PRILOSEC) 40 MG capsule Take 40 mg by mouth daily.    . risperiDONE (RISPERDAL) 3 MG tablet Take 3 mg by mouth 2 (two) times daily.    . Vitamin D, Ergocalciferol, (DRISDOL) 50000 units CAPS capsule Take 50,000 Units by mouth every 7 (seven) days.       Marland Kitchen atorvastatin  40 mg Oral q1800  . benztropine  1 mg Oral QHS  . chlorhexidine  15 mL Mouth Rinse BID  . chlorproMAZINE  50 mg Oral BID  . memantine  28 mg Oral Daily   And  . donepezil  10 mg Oral Daily  . enoxaparin (LOVENOX) injection  40  mg Subcutaneous Q24H  . escitalopram  10 mg Oral Daily  . famotidine (PEPCID) IV  20 mg Intravenous Q12H  . fentaNYL (SUBLIMAZE) injection  50 mcg Intravenous Once  . haloperidol  5 mg Oral BID  . levothyroxine  25 mcg Oral BH-q7a  . mouth rinse  15 mL Mouth Rinse QID  . omega-3 acid ethyl esters  1 g Oral Daily  . pantoprazole  40 mg Oral Daily  . piperacillin-tazobactam (ZOSYN)  IV  3.375 g Intravenous Q8H  . risperiDONE  3 mg Oral BID  . sodium chloride flush  3 mL Intravenous Q12H  . Vitamin D (Ergocalciferol)  50,000 Units Oral Q7 days    Infusions: . dextrose 50 mL/hr at 02/26/16 2323  . feeding supplement (VITAL HIGH PROTEIN)    . fentaNYL infusion INTRAVENOUS 50 mcg/hr (02/26/16 2251)  . propofol (DIPRIVAN) infusion 5 mcg/kg/min (02/27/16 0415)    No Known Allergies  Social History   Social History  . Marital status: Single    Spouse name: N/A  . Number of children: N/A  . Years of education:  N/A   Occupational History  . Not on file.   Social History Main Topics  . Smoking status: Never Smoker  . Smokeless tobacco: Never Used  . Alcohol use No  . Drug use: Unknown  . Sexual activity: Not on file   Other Topics Concern  . Not on file   Social History Narrative  . No narrative on file    Family History  Problem Relation Age of Onset  . Family history unknown: Yes    PHYSICAL EXAM: Vitals:   02/27/16 1100 02/27/16 1300  BP: (!) 127/55 127/86  Pulse: (!) 47 (!) 54  Resp: 16 18  Temp:  97.6 F (36.4 C)     Intake/Output Summary (Last 24 hours) at 02/27/16 1435 Last data filed at 02/27/16 0826  Gross per 24 hour  Intake           611.45 ml  Output              276 ml  Net           335.45 ml    General:  Well appearing. No respiratory difficulty HEENT: normal Neck: supple. no JVD. Carotids 2+ bilat; no bruits. No lymphadenopathy or thryomegaly appreciated. Cor: PMI nondisplaced. Regular rate & rhythm. No rubs, gallops or murmurs. Lungs:  clear Abdomen: soft, nontender, nondistended. No hepatosplenomegaly. No bruits or masses. Good bowel sounds. Extremities: no cyanosis, clubbing, rash, edema Neuro: alert & oriented x 3, cranial nerves grossly intact. moves all 4 extremities w/o difficulty. Affect pleasant.  ECG: Sinus bradycardia 49/min LBBB  Results for orders placed or performed during the hospital encounter of 02/26/16 (from the past 24 hour(s))  Glucose, capillary     Status: Abnormal   Collection Time: 02/26/16  9:07 PM  Result Value Ref Range   Glucose-Capillary 113 (H) 65 - 99 mg/dL  MRSA PCR Screening     Status: None   Collection Time: 02/26/16  9:12 PM  Result Value Ref Range   MRSA by PCR NEGATIVE NEGATIVE  Troponin I     Status: None   Collection Time: 02/26/16  9:23 PM  Result Value Ref Range   Troponin I <0.03 <0.03 ng/mL  Magnesium     Status: None   Collection Time: 02/26/16  9:40 PM  Result Value Ref Range   Magnesium 1.8 1.7 - 2.4 mg/dL  Basic metabolic panel     Status: Abnormal   Collection Time: 02/27/16  1:05 AM  Result Value Ref Range   Sodium 143 135 - 145 mmol/L   Potassium 4.4 3.5 - 5.1 mmol/L   Chloride 113 (H) 101 - 111 mmol/L   CO2 22 22 - 32 mmol/L   Glucose, Bld 102 (H) 65 - 99 mg/dL   BUN 26 (H) 6 - 20 mg/dL   Creatinine, Ser 1.610.89 0.61 - 1.24 mg/dL   Calcium 8.7 (L) 8.9 - 10.3 mg/dL   GFR calc non Af Amer >60 >60 mL/min   GFR calc Af Amer >60 >60 mL/min   Anion gap 8 5 - 15  Troponin I     Status: None   Collection Time: 02/27/16  1:05 AM  Result Value Ref Range   Troponin I <0.03 <0.03 ng/mL  CBC with Differential/Platelet     Status: Abnormal   Collection Time: 02/27/16  1:05 AM  Result Value Ref Range   WBC 10.7 (H) 3.8 - 10.6 K/uL   RBC 3.37 (L) 4.40 - 5.90 MIL/uL   Hemoglobin 11.5 (  L) 13.0 - 18.0 g/dL   HCT 16.1 (L) 09.6 - 04.5 %   MCV 98.2 80.0 - 100.0 fL   MCH 34.2 (H) 26.0 - 34.0 pg   MCHC 34.8 32.0 - 36.0 g/dL   RDW 40.9 81.1 - 91.4 %   Platelets 136 (L)  150 - 440 K/uL   Neutrophils Relative % 78 %   Neutro Abs 8.3 (H) 1.4 - 6.5 K/uL   Lymphocytes Relative 12 %   Lymphs Abs 1.3 1.0 - 3.6 K/uL   Monocytes Relative 7 %   Monocytes Absolute 0.7 0.2 - 1.0 K/uL   Eosinophils Relative 1 %   Eosinophils Absolute 0.1 0 - 0.7 K/uL   Basophils Relative 2 %   Basophils Absolute 0.2 (H) 0 - 0.1 K/uL  Blood gas, arterial     Status: Abnormal   Collection Time: 02/27/16  5:00 AM  Result Value Ref Range   FIO2 0.50    Delivery systems VENTILATOR    pH, Arterial 7.53 (H) 7.350 - 7.450   pCO2 arterial 35 32.0 - 48.0 mmHg   pO2, Arterial 256 (H) 83.0 - 108.0 mmHg   Bicarbonate 29.2 (H) 20.0 - 28.0 mmol/L   Acid-Base Excess 6.1 (H) 0.0 - 2.0 mmol/L   O2 Saturation 99.9 %   Patient temperature 37.0    Collection site LEFT RADIAL    Sample type ARTERIAL DRAW    Allens test (pass/fail) PASS PASS  Troponin I     Status: Abnormal   Collection Time: 02/27/16  5:25 AM  Result Value Ref Range   Troponin I 0.03 (HH) <0.03 ng/mL  Phosphorus     Status: Abnormal   Collection Time: 02/27/16  5:25 AM  Result Value Ref Range   Phosphorus 1.5 (L) 2.5 - 4.6 mg/dL  Triglycerides     Status: None   Collection Time: 02/27/16  5:25 AM  Result Value Ref Range   Triglycerides 76 <150 mg/dL  Troponin I (q 6hr x 3)     Status: Abnormal   Collection Time: 02/27/16  9:51 AM  Result Value Ref Range   Troponin I 0.03 (HH) <0.03 ng/mL  Magnesium     Status: None   Collection Time: 02/27/16 12:53 PM  Result Value Ref Range   Magnesium 1.8 1.7 - 2.4 mg/dL  Phosphorus     Status: Abnormal   Collection Time: 02/27/16 12:53 PM  Result Value Ref Range   Phosphorus 2.4 (L) 2.5 - 4.6 mg/dL   Dg Abd 1 View  Result Date: 02/26/2016 CLINICAL DATA:  Orogastric tube placement. EXAM: ABDOMEN - 1 VIEW COMPARISON:  None. FINDINGS: The orogastric tube enters the stomach and terminates in the stomach body. Gas-filled stomach noted. Leads from defibrillator pad noted in the left  upper quadrant. Lumbar spondylosis and degenerative disc disease. Visualized bowel gas pattern normal. IMPRESSION: Nasogastric tube tip is in the stomach body. Electronically Signed   By: Gaylyn Rong M.D.   On: 02/26/2016 23:05   Ct Head Wo Contrast  Result Date: 02/26/2016 CLINICAL DATA:  Cardiac arrest with asystole. EXAM: CT HEAD WITHOUT CONTRAST TECHNIQUE: Contiguous axial images were obtained from the base of the skull through the vertex without intravenous contrast. COMPARISON:  02/26/2016 at 10:57 a.m. FINDINGS: Brain: Cerebral atrophy for age. Mild chronic ischemic microvascular white matter disease. Preserved gray-white junction without compelling CT findings of global anoxic event. Stable ex vacuo prominence of the ventricular system. No intracranial hemorrhage or mass lesion. Vascular: There is some  atherosclerotic calcification of the dominant right vertebral artery. Skull: Unremarkable Sinuses/Orbits: There is some fluid dependently in the nasopharynx probably related to the oral intubation. Sinuses clear. Orbits unremarkable. Other: No supplemental non-categorized findings. IMPRESSION: 1. Chronically stable appearance of cerebral atrophy with ex vacuo prominence of the ventricular system. No findings of acute CVA, stigmata of recent global anoxic event, intracranial hemorrhage, or mass lesion identified. Electronically Signed   By: Gaylyn Rong M.D.   On: 02/26/2016 22:50   Ct Head Wo Contrast  Result Date: 02/26/2016 CLINICAL DATA:  Altered mental status. Upon awakening this am the pt was able to sit up on the side of the bed and seemed okay but when called breakfast he could not come. EXAM: CT HEAD WITHOUT CONTRAST TECHNIQUE: Contiguous axial images were obtained from the base of the skull through the vertex without intravenous contrast. COMPARISON:  10/31/2015, 05/20/2015 FINDINGS: Brain: No evidence of acute infarction, hemorrhage, hydrocephalus, extra-axial collection or mass  lesion/mass effect. There is generalized cerebral atrophy. There is mild periventricular white matter low attenuation likely secondary to microvascular disease. Vascular: No hyperdense vessel or unexpected calcification. Skull: No osseous abnormality. Sinuses/Orbits: Visualized paranasal sinuses are clear. Visualized mastoid sinuses are clear. Visualized orbits demonstrate no focal abnormality. Other: None IMPRESSION: 1. No acute intracranial pathology. Electronically Signed   By: Elige Ko   On: 02/26/2016 11:07   Mr Brain Wo Contrast  Result Date: 02/27/2016 CLINICAL DATA:  Altered mental status. Patient is a long-term group home resident. Difficulty walking. EXAM: MRI HEAD WITHOUT CONTRAST TECHNIQUE: Multiplanar, multiecho pulse sequences of the brain and surrounding structures were obtained without intravenous contrast. COMPARISON:  CT head 02/26/2016.  MR head 01/04/2006. FINDINGS: The patient was unable to remain motionless for the exam. Small or subtle lesions could be overlooked. Brain: No acute infarction, hemorrhage, extra-axial collection or mass lesion. Global atrophy. The hydrocephalus ex vacuo. Mild white matter disease, predominantly periventricular, difficult to characterize with regard to chronic microvascular ischemic change versus transependymal absorption. Vascular: Flow voids are maintained throughout the carotid, basilar, and vertebral arteries. There are no areas of chronic hemorrhage. Skull and upper cervical spine: Normal marrow signal. No tonsillar herniation. Sinuses/Orbits: Negative. 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 It is unclear if this was removed via some occult surgical approach or resolved spontaneously. IMPRESSION: Motion degraded exam demonstrating advanced atrophy with ventriculomegaly. No acute intracranial findings are evident. 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 Electronically  Signed   By: Elsie Stain M.D.   On: 02/27/2016 13:00   Dg Chest Port 1 View  Result Date: 02/27/2016 CLINICAL DATA:  Intubation. EXAM: PORTABLE CHEST 1 VIEW COMPARISON:  02/26/2016 . FINDINGS: Endotracheal tube and NG tube in stable position. Mediastinum hilar structures are normal. Heart size stable. No focal infiltrate. No pleural effusion or pneumothorax. Degenerative changes thoracic spine. The degenerative changes again noted about both shoulders. IMPRESSION: 1.  Lines and tubes in stable position. 2.  No acute cardiopulmonary disease. Electronically Signed   By: Maisie Fus  Register   On: 02/27/2016 07:24   Dg Chest Port 1 View  Result Date: 02/26/2016 CLINICAL DATA:  Code blue, intubated EXAM: PORTABLE CHEST 1 VIEW COMPARISON:  02/21/2016 FINDINGS: Cardiomediastinal silhouette is stable. No acute infiltrate or pulmonary edema. Endotracheal tube in place with tip 2.5 cm above the carina. NG tube in place with tip in proximal stomach. There is no pneumothorax. Degenerative changes are noted left shoulder. IMPRESSION:  No active disease. Endotracheal and NG tube in place. No pneumothorax. Electronically Signed   By: Natasha Mead M.D.   On: 02/26/2016 21:29   Dg Chest Port 1 View  Result Date: 02/26/2016 CLINICAL DATA:  Decreased activity level. Decreased communication, not walking like normal. EXAM: PORTABLE CHEST 1 VIEW COMPARISON:  Portable chest x-ray of September 16, 2015 FINDINGS: The lungs are adequately inflated and clear. The heart and pulmonary vascularity are normal. The mediastinum is normal in width. There is no pleural effusion. There is calcification in the wall of the aortic arch. There is considerable degenerative change of both shoulders with chronic inferior subluxation of the right humeral head with respect to the glenoid. IMPRESSION: There is no active cardiopulmonary disease. Aortic atherosclerosis. Chronic changes of both shoulders. Electronically Signed   By: David  [place] M.D.   On:  02/26/2016 14:29     ASSESSMENT AND PLAN:Altered mental status with intubated/unresponsive. Also sinus bradycardia but hemodynamically ok. ECHO has EF 50 % with apical thrombus, advise IV heparin and then switch to coumadin and neuro consult.  Chaselynn Kepple A

## 2016-02-27 NOTE — Progress Notes (Addendum)
Siezure noticed while at CT scan around 2200. Head and bilateral hand twitches observed during seizure. [place], NP made aware. Ordered to give ativan as needed for seizures and started dose of Keppra. Will continue to monitor

## 2016-02-27 NOTE — Progress Notes (Signed)
Notified Dr. Thad Rangereynolds describing patient's jerk-like movements with his head and mouth.  Patient given 2 doses of ativan during this shift due to those movements that could be possible seizures.

## 2016-02-27 NOTE — Progress Notes (Signed)
ANTICOAGULATION CONSULT NOTE - Initial Consult  Pharmacy Consult for Heparin  Indication: Left ventricular thrombus  No Known Allergies  Patient Measurements: Height: 6' (182.9 cm) Weight: 144 lb 8 oz (65.5 kg) IBW/kg (Calculated) : 77.6 Heparin Dosing Weight: 65.5 kg   Vital Signs: Temp: 98.6 F (37 C) (09/08 2000) Temp Source: Axillary (09/08 2000) BP: 118/61 (09/08 2200) Pulse Rate: 58 (09/08 2200)  Labs:  Recent Labs  02/26/16 1032  02/27/16 0105 02/27/16 0525 02/27/16 0951 02/27/16 1658 02/27/16 2248  HGB 12.3*  --  11.5*  --   --   --   --   HCT 35.3*  --  33.1*  --   --   --   --   PLT 145*  --  136*  --   --   --   --   APTT  --   --   --   --   --  40*  --   LABPROT  --   --   --   --   --  14.5  --   INR  --   --   --   --   --  1.12  --   HEPARINUNFRC  --   --   --   --   --   --  1.02*  CREATININE 0.98  --  0.89  --   --   --   --   TROPONINI <0.03  < > <0.03 0.03* 0.03*  --   --   < > = values in this interval not displayed.  Estimated Creatinine Clearance: 76.7 mL/min (by C-G formula based on SCr of 0.89 mg/dL).   Medical History: Past Medical History:  Diagnosis Date  . Anxiety   . Bipolar 1 disorder (HCC)   . Bone disorder   . Depression   . GERD (gastroesophageal reflux disease)     Medications:  Scheduled:  . atorvastatin  40 mg Oral q1800  . benztropine  1 mg Oral QHS  . chlorhexidine  15 mL Mouth Rinse BID  . chlorproMAZINE  50 mg Oral BID  . memantine  28 mg Oral Daily   And  . donepezil  10 mg Oral Daily  . escitalopram  10 mg Oral Daily  . famotidine (PEPCID) IV  20 mg Intravenous Q12H  . fentaNYL (SUBLIMAZE) injection  50 mcg Intravenous Once  . haloperidol  5 mg Oral BID  . levothyroxine  25 mcg Oral BH-q7a  . [START ON 02/28/2016] magnesium sulfate 1 - 4 g bolus IVPB  1 g Intravenous Once  . mouth rinse  15 mL Mouth Rinse 10 times per day  . omega-3 acid ethyl esters  1 g Oral Daily  . piperacillin-tazobactam (ZOSYN)  IV   3.375 g Intravenous Q8H  . risperiDONE  3 mg Oral BID  . sodium chloride flush  3 mL Intravenous Q12H  . Vitamin D (Ergocalciferol)  50,000 Units Oral Q7 days    Assessment: Pharmacy consulted to dose heparin in this 65 year old male with left ventricular thrombus.  Pt was previously on lovenox 40 mg SQ Q24H, now d/c'd.  Last dose was on 9/7 @ 22:52.  Goal of Therapy:  Heparin level 0.3-0.7 units/ml Monitor platelets by anticoagulation protocol: Yes   Plan:  Will order baseline INR and aPTT.  Give 3900 units bolus x 1 Start heparin infusion at 1100 units/hr Check anti-Xa level in 6 hours and daily while on heparin Continue to monitor  H&H and platelets   9/8 PM heparin level 1.02.  Hold x1 hour and decrease to 900 units/hr and recheck in 6 hours. CBC in AM.  Eugean Arnott S 02/27/2016,11:40 PM

## 2016-02-27 NOTE — Progress Notes (Signed)
ANTICOAGULATION CONSULT NOTE - Initial Consult  Pharmacy Consult for Heparin  Indication: Left ventricular thrombus  No Known Allergies  Patient Measurements: Height: 6' (182.9 cm) Weight: 144 lb 8 oz (65.5 kg) IBW/kg (Calculated) : 77.6 Heparin Dosing Weight: 65.5 kg   Vital Signs: Temp: 97.6 F (36.4 C) (09/08 1300) Temp Source: Oral (09/08 1300) BP: 103/60 (09/08 1400) Pulse Rate: 48 (09/08 1400)  Labs:  Recent Labs  02/26/16 1032  02/27/16 0105 02/27/16 0525 02/27/16 0951  HGB 12.3*  --  11.5*  --   --   HCT 35.3*  --  33.1*  --   --   PLT 145*  --  136*  --   --   CREATININE 0.98  --  0.89  --   --   TROPONINI <0.03  < > <0.03 0.03* 0.03*  < > = values in this interval not displayed.  Estimated Creatinine Clearance: 76.7 mL/min (by C-G formula based on SCr of 0.89 mg/dL).   Medical History: Past Medical History:  Diagnosis Date  . Anxiety   . Bipolar 1 disorder (HCC)   . Bone disorder   . Depression   . GERD (gastroesophageal reflux disease)     Medications:  Scheduled:  . atorvastatin  40 mg Oral q1800  . benztropine  1 mg Oral QHS  . chlorhexidine  15 mL Mouth Rinse BID  . chlorproMAZINE  50 mg Oral BID  . memantine  28 mg Oral Daily   And  . donepezil  10 mg Oral Daily  . escitalopram  10 mg Oral Daily  . famotidine (PEPCID) IV  20 mg Intravenous Q12H  . fentaNYL (SUBLIMAZE) injection  50 mcg Intravenous Once  . haloperidol  5 mg Oral BID  . heparin  3,900 Units Intravenous Once  . levothyroxine  25 mcg Oral BH-q7a  . mouth rinse  15 mL Mouth Rinse QID  . omega-3 acid ethyl esters  1 g Oral Daily  . piperacillin-tazobactam (ZOSYN)  IV  3.375 g Intravenous Q8H  . risperiDONE  3 mg Oral BID  . sodium chloride flush  3 mL Intravenous Q12H  . Vitamin D (Ergocalciferol)  50,000 Units Oral Q7 days    Assessment: Pharmacy consulted to dose heparin in this 65 year old male with left ventricular thrombus.  Pt was previously on lovenox 40 mg SQ  Q24H, now d/c'd.  Last dose was on 9/7 @ 22:52.  Goal of Therapy:  Heparin level 0.3-0.7 units/ml Monitor platelets by anticoagulation protocol: Yes   Plan:  Will order baseline INR and aPTT.  Give 3900 units bolus x 1 Start heparin infusion at 1100 units/hr Check anti-Xa level in 6 hours and daily while on heparin Continue to monitor H&H and platelets  Ceola Para D 02/27/2016,4:15 PM

## 2016-02-27 NOTE — Progress Notes (Signed)
Notified Dr. Ardyth Manam that patient's heartrate will be brady in the 40's at one time then will jump up to the 160's- non-sustained.  This happens especially when patient has seizure like jerks with his head/mouth.  Dr. Ardyth Manam ordered to have cardiology consult placed.

## 2016-02-27 NOTE — Progress Notes (Signed)
PT Cancellation Note  Patient Details Name: Warren NeighborRalph M Linquist Jr. MRN: [id number] DOB: 03/09/1951   Cancelled Treatment:    Reason Eval/Treat Not Completed: Medical issues which prohibited therapy. Chart reviewed. Since initial PT order patient admitted to the ICU post cardiac arrest. Intubated and mechanically ventilated. No continue upon transfer order received. Per protocol, due to decline in status will complete current PT order. Please enter new PT order if indicated.  Sharalyn InkJason D Huprich PT, DPT   Huprich,Jason 02/27/2016, 8:51 AM

## 2016-02-27 NOTE — Progress Notes (Signed)
[facility] Critical Care Medicine Progess Note    ASSESSMENT/PLAN    DISCUSSION: 65 year old male came with altered mental status. Was in Asystole, momentarily, ROSC . HR was in 84. Intubated for agonal respirations. Now seizing. Loaded with [facility] Neurology consulted.  ASSESSMENT / PLAN:  PULMONARY A: Acute respiratory failure Vent: PRVC/450/16/5/40%  P:   Continue vent support Fentanyl/Versed Routine ABGs CXR in a.m.  CARDIOVASCULAR A:  PEA Cardiac arrest Hyperlipidemia P:  continuous telemetry Keep MAP goals>65 Trend troponins Continue lipitor Stat EKG  RENAL A:   No active issues Na High normal P:   Replace electrolytes per ICU protocol D5W  GASTROINTESTINAL A:   Hx of GERD P:   Continue Pepcid  HEMATOLOGIC A:   No active issues P:  lovenox for DVT prophylaxis  INFECTIOUS A:   No active issues P:   Zosyn   CULTURES: 9/7 urine culture>>  ANTIBIOTICS: 9/7 Zosyn>>  ENDOCRINE A:   NO Active issues P:   Blood sugar checks with BMP  NEUROLOGIC A:   Acute encephalopathy History of bipolar disorder History of depression P:   RASS goal:0 to -1 Lorazepam PRN Loaded with Kepra Neurology consulted  STUDIES:  9/7 CT head WO contrast>>No findings of acute CVA,stigmata of recent global anoxic event, intracranial hemorrhage, ormass lesion identified.   SIGNIFICANT EVENTS: 9/7 patient admitted to the ICU post cardiac arrest. Intubated and mechanically ventilated.  LINES/TUBES: none  Best Practices  DVT Prophylaxis: heparin GI Prophylaxis: --   ---------------------------------------   ----------------------------------------   Name: Eleanora Neighbor. MRN: [id number] DOB: 1950-11-26    ADMISSION DATE:  02/26/2016     STUDIES:  --   SUBJECTIVE:   Pt currently on the ventilator, can not provide history or review of systems.   Review of Systems:  Constitutional: Feels well. Cardiovascular: No chest pain.   Pulmonary: Denies dyspnea.   The remainder of systems were reviewed and were found to be negative other than what is documented in the HPI.    VITAL SIGNS: Temp:  [97.6 F (36.4 C)-98.4 F (36.9 C)] 98.4 F (36.9 C) (09/08 0834) Pulse Rate:  [49-86] 49 (09/08 0500) Resp:  [9-20] 16 (09/08 0500) BP: (112-161)/(30-115) 148/73 (09/08 0500) SpO2:  [97 %-100 %] 100 % (09/08 0829) FiO2 (%):  [40 %-50 %] 40 % (09/08 0829) Weight:  [144 lb 8 oz (65.5 kg)-165 lb (74.8 kg)] 144 lb 8 oz (65.5 kg) (09/07 1555) HEMODYNAMICS:   VENTILATOR SETTINGS: Vent Mode: PRVC FiO2 (%):  [40 %-50 %] 40 % Set Rate:  [16 bmp] 16 bmp Vt Set:  [450 mL] 450 mL PEEP:  [5 cmH20] 5 cmH20 Plateau Pressure:  [16 cmH20] 16 cmH20 INTAKE / OUTPUT:  Intake/Output Summary (Last 24 hours) at 02/27/16 0940 Last data filed at 02/27/16 0826  Gross per 24 hour  Intake           611.45 ml  Output              276 ml  Net           335.45 ml    PHYSICAL EXAMINATION: Physical Examination:   VS: BP (!) 148/73   Pulse (!) 49   Temp 98.4 F (36.9 C) (Axillary)   Resp 16   Ht 6' (1.829 m)   Wt 144 lb 8 oz (65.5 kg)   SpO2 100%   BMI 19.60 kg/m   General Appearance: No distress  Neuro:without focal findings, mental status normal. HEENT: PERRLA, EOM  intact. Pulmonary: normal breath sounds   CardiovascularNormal S1,S2.  No m/r/g.   Abdomen: Benign, Soft, non-tender. Renal:  No costovertebral tenderness  GU:  Not performed at this time. Endocrine: No evident thyromegaly. Skin:   warm, no rashes, no ecchymosis  Extremities: normal, no cyanosis, clubbing.   LABS:   LABORATORY PANEL:   CBC  Recent Labs Lab 02/27/16 0105  WBC 10.7*  HGB 11.5*  HCT 33.1*  PLT 136*    Chemistries   Recent Labs Lab 02/26/16 1032 02/26/16 2140 02/27/16 0105 02/27/16 0525  NA 145  --  143  --   K 4.9  --  4.4  --   CL 110  --  113*  --   CO2 30  --  22  --   GLUCOSE 89  --  102*  --   BUN 33*  --  26*  --     CREATININE 0.98  --  0.89  --   CALCIUM 9.3  --  8.7*  --   MG  --  1.8  --   --   PHOS  --   --   --  1.5*  AST 29  --   --   --   ALT 20  --   --   --   ALKPHOS 87  --   --   --   BILITOT 1.2  --   --   --      Recent Labs Lab 02/26/16 2107  GLUCAP 113*    Recent Labs Lab 02/27/16 0500  PHART 7.53*  PCO2ART 35  PO2ART 256*    Recent Labs Lab 02/26/16 1032  AST 29  ALT 20  ALKPHOS 87  BILITOT 1.2  ALBUMIN 3.6    Cardiac Enzymes  Recent Labs Lab 02/27/16 0525  TROPONINI 0.03*    RADIOLOGY:  Dg Abd 1 View  Result Date: 02/26/2016 CLINICAL DATA:  Orogastric tube placement. EXAM: ABDOMEN - 1 VIEW COMPARISON:  None. FINDINGS: The orogastric tube enters the stomach and terminates in the stomach body. Gas-filled stomach noted. Leads from defibrillator pad noted in the left upper quadrant. Lumbar spondylosis and degenerative disc disease. Visualized bowel gas pattern normal. IMPRESSION: Nasogastric tube tip is in the stomach body. Electronically Signed   By: Gaylyn Rong M.D.   On: 02/26/2016 23:05   Ct Head Wo Contrast  Result Date: 02/26/2016 CLINICAL DATA:  Cardiac arrest with asystole. EXAM: CT HEAD WITHOUT CONTRAST TECHNIQUE: Contiguous axial images were obtained from the base of the skull through the vertex without intravenous contrast. COMPARISON:  02/26/2016 at 10:57 a.m. FINDINGS: Brain: Cerebral atrophy for age. Mild chronic ischemic microvascular white matter disease. Preserved gray-white junction without compelling CT findings of global anoxic event. Stable ex vacuo prominence of the ventricular system. No intracranial hemorrhage or mass lesion. Vascular: There is some atherosclerotic calcification of the dominant right vertebral artery. Skull: Unremarkable Sinuses/Orbits: There is some fluid dependently in the nasopharynx probably related to the oral intubation. Sinuses clear. Orbits unremarkable. Other: No supplemental non-categorized findings.  IMPRESSION: 1. Chronically stable appearance of cerebral atrophy with ex vacuo prominence of the ventricular system. No findings of acute CVA, stigmata of recent global anoxic event, intracranial hemorrhage, or mass lesion identified. Electronically Signed   By: Gaylyn Rong M.D.   On: 02/26/2016 22:50   Ct Head Wo Contrast  Result Date: 02/26/2016 CLINICAL DATA:  Altered mental status. Upon awakening this am the pt was able to sit up on the  side of the bed and seemed okay but when called breakfast he could not come. EXAM: CT HEAD WITHOUT CONTRAST TECHNIQUE: Contiguous axial images were obtained from the base of the skull through the vertex without intravenous contrast. COMPARISON:  10/31/2015, 05/20/2015 FINDINGS: Brain: No evidence of acute infarction, hemorrhage, hydrocephalus, extra-axial collection or mass lesion/mass effect. There is generalized cerebral atrophy. There is mild periventricular white matter low attenuation likely secondary to microvascular disease. Vascular: No hyperdense vessel or unexpected calcification. Skull: No osseous abnormality. Sinuses/Orbits: Visualized paranasal sinuses are clear. Visualized mastoid sinuses are clear. Visualized orbits demonstrate no focal abnormality. Other: None IMPRESSION: 1. No acute intracranial pathology. Electronically Signed   By: Elige KoHetal  Patel   On: 02/26/2016 11:07   Dg Chest Port 1 View  Result Date: 02/27/2016 CLINICAL DATA:  Intubation. EXAM: PORTABLE CHEST 1 VIEW COMPARISON:  02/26/2016 . FINDINGS: Endotracheal tube and NG tube in stable position. Mediastinum hilar structures are normal. Heart size stable. No focal infiltrate. No pleural effusion or pneumothorax. Degenerative changes thoracic spine. The degenerative changes again noted about both shoulders. IMPRESSION: 1.  Lines and tubes in stable position. 2.  No acute cardiopulmonary disease. Electronically Signed   By: Maisie Fushomas  Register   On: 02/27/2016 07:24   Dg Chest Port 1  View  Result Date: 02/26/2016 CLINICAL DATA:  Code blue, intubated EXAM: PORTABLE CHEST 1 VIEW COMPARISON:  02/21/2016 FINDINGS: Cardiomediastinal silhouette is stable. No acute infiltrate or pulmonary edema. Endotracheal tube in place with tip 2.5 cm above the carina. NG tube in place with tip in proximal stomach. There is no pneumothorax. Degenerative changes are noted left shoulder. IMPRESSION: No active disease. Endotracheal and NG tube in place. No pneumothorax. Electronically Signed   By: Natasha MeadLiviu  Pop M.D.   On: 02/26/2016 21:29   Dg Chest Port 1 View  Result Date: 02/26/2016 CLINICAL DATA:  Decreased activity level. Decreased communication, not walking like normal. EXAM: PORTABLE CHEST 1 VIEW COMPARISON:  Portable chest x-ray of September 16, 2015 FINDINGS: The lungs are adequately inflated and clear. The heart and pulmonary vascularity are normal. The mediastinum is normal in width. There is no pleural effusion. There is calcification in the wall of the aortic arch. There is considerable degenerative change of both shoulders with chronic inferior subluxation of the right humeral head with respect to the glenoid. IMPRESSION: There is no active cardiopulmonary disease. Aortic atherosclerosis. Chronic changes of both shoulders. Electronically Signed   By: David  SwazilandJordan M.D.   On: 02/26/2016 14:29       --Wells Guileseep Ermine Stebbins, MD.  ICU Pager: [phone] [facility] Office Number: [phone]  Santiago Gladavid Kasa, M.D.  Stephanie AcreVishal Mungal, M.D.  Billy Fischeravid Simonds, M.D  02/27/2016   Critical Care Attestation.  I have personally obtained a history, examined the patient, evaluated laboratory and imaging results, formulated the assessment and plan and placed orders. The Patient requires high complexity decision making for assessment and support, frequent evaluation and titration of therapies, application of advanced monitoring technologies and extensive interpretation of multiple databases. The  patient has critical illness that could lead imminently to failure of 1 or more organ systems and requires the highest level of physician preparedness to intervene.  Critical Care Time devoted to patient care services described in this note is 35 minutes and is exclusive of time spent in procedures.

## 2016-02-27 NOTE — Progress Notes (Signed)
Patient transported to and from MRI on trilogy without complication.   BVM available throughout transport.  Patient placed on Servo MRI vent for MRI procedure without complication.  Patient returned to ICU 13 and placed back on Servo I vent post MRI

## 2016-02-27 NOTE — Progress Notes (Signed)
Cardiology recommendations noted, echocardiogram showed LV thrombus. We'll start the patient on IV heparin, and discontinued. DVT prophylaxis. The patient is currently undergoing EEG, will wait for results.  Wells Guiles-Deep Jackilyn Umphlett, M.D. 02/27/2016

## 2016-02-27 NOTE — Consult Note (Signed)
Reason for Consult:Twitching, ? seizure Referring Physician: Nicholos Johns  CC: Twitching  HPI: Warren [name]. is an 65 y.o. male who was sent to emergency room by his caretaker because he is not himself today. Normally he is able to walk without any support he is very talkative but yesterday he was noted confused and talking little and staring into space. Patient wa admitted for further work up and after admission had an asystolic episode.  He was intubated and sedated.  Has been noted to have twitching in his face and at times his upper extremity, after which he will become tachycardic.  Concern is for seizure.  Past Medical History:  Diagnosis Date  . Anxiety   . Bipolar 1 disorder (HCC)   . Bone disorder   . Depression   . GERD (gastroesophageal reflux disease)     History reviewed. No pertinent surgical history.  Family History  Problem Relation Age of Onset  . Family history unknown: Yes    Social History:  reports that he has never smoked. He has never used smokeless tobacco. He reports that he does not drink alcohol. His drug history is not on file.  No Known Allergies  Medications:  I have reviewed the patient's current medications. Prior to Admission:  Prescriptions Prior to Admission  Medication Sig Dispense Refill Last Dose  . acetaminophen (TYLENOL) 500 MG tablet Take 500 mg by mouth 2 (two) times daily as needed.   prn at prn  . alendronate (FOSAMAX) 70 MG tablet Take 70 mg by mouth once a week. Take with a full glass of water on an empty stomach.   02/23/2016 at 0600  . ALPRAZolam (XANAX) 1 MG tablet Take 1 mg by mouth 2 (two) times daily as needed for anxiety.   02/26/2016 at 0800  . benztropine (COGENTIN) 1 MG tablet Take 1 mg by mouth at bedtime.   02/25/2016 at 2000  . chlorproMAZINE (THORAZINE) 50 MG tablet Take 50 mg by mouth 2 (two) times daily.   02/26/2016 at 0800  . escitalopram (LEXAPRO) 10 MG tablet Take 10 mg by mouth daily.   02/26/2016 at 0800  . haloperidol  (HALDOL) 5 MG tablet Take 5 mg by mouth 2 (two) times daily. 2nd dose at 1600   02/26/2016 at 0800  . levothyroxine (SYNTHROID, LEVOTHROID) 25 MCG tablet Take 25 mcg by mouth every morning.   02/26/2016 at 0700  . NAMZARIC 28-10 MG CP24 Take 1 tablet by mouth daily.   02/26/2016 at 0800  . Omega-3 Fatty Acids (FISH OIL) 1000 MG CPDR Take 2 capsules by mouth 2 (two) times daily.   02/26/2016 at 0800  . omeprazole (PRILOSEC) 40 MG capsule Take 40 mg by mouth daily.   02/26/2016 at 0700  . risperiDONE (RISPERDAL) 3 MG tablet Take 3 mg by mouth 2 (two) times daily.   02/26/2016 at 0800  . Vitamin D, Ergocalciferol, (DRISDOL) 50000 units CAPS capsule Take 50,000 Units by mouth every 7 (seven) days.   02/17/2016 at 0800   Scheduled: . atorvastatin  40 mg Oral q1800  . benztropine  1 mg Oral QHS  . chlorhexidine  15 mL Mouth Rinse BID  . chlorproMAZINE  50 mg Oral BID  . memantine  28 mg Oral Daily   And  . donepezil  10 mg Oral Daily  . escitalopram  10 mg Oral Daily  . famotidine (PEPCID) IV  20 mg Intravenous Q12H  . fentaNYL (SUBLIMAZE) injection  50 mcg Intravenous Once  .  haloperidol  5 mg Oral BID  . heparin  3,900 Units Intravenous Once  . levothyroxine  25 mcg Oral BH-q7a  . mouth rinse  15 mL Mouth Rinse QID  . omega-3 acid ethyl esters  1 g Oral Daily  . piperacillin-tazobactam (ZOSYN)  IV  3.375 g Intravenous Q8H  . risperiDONE  3 mg Oral BID  . sodium chloride flush  3 mL Intravenous Q12H  . Vitamin D (Ergocalciferol)  50,000 Units Oral Q7 days    ROS: Unable to provide due to mental status  Physical Examination: Blood pressure 103/60, pulse (!) 48, temperature 97.6 F (36.4 C), temperature source Oral, resp. rate 16, height 6' (1.829 m), weight 65.5 kg (144 lb 8 oz), SpO2 100 %.  HEENT-  Normocephalic, no lesions, without obvious abnormality.  Normal external eye and conjunctiva.  Normal TM's bilaterally.  Normal auditory canals and external ears. Normal external nose, mucus membranes  and septum.  Normal pharynx. Cardiovascular- S1, S2 normal, pulses palpable throughout   Lungs- chest clear, no wheezing, rales, normal symmetric air entry Abdomen- soft, non-tender; bowel sounds normal; no masses,  no organomegaly Extremities- no edema Lymph-no adenopathy palpable Musculoskeletal-no joint tenderness, deformity or swelling Skin-warm and dry, no hyperpigmentation, vitiligo, or suspicious lesions  Neurological Examination Mental Status: Opens eyes after oculocephalic maneuver and attempts to speak verbally.  Does not respond to deep sternal rub.  Does not follow commands.   Cranial Nerves: II: Responds to confrontation bilaterally, pupils unequal, irregular and unreactive III,IV,VI: With oculocephalic maneuver patient does not go beyond midline to the left V,VII: corneal reflex present bilaterally  VIII: grossly intact IX,X: gag reflex unable to test, XI: trapezius strength unable to test bilaterally XII: tongue strength unable to test Motor: Patient will hold arms up to some degree with no focal weakness noted.  Does not move lower extremities Sensory: Does not respond to noxious stimuli in any extremity. Deep Tendon Reflexes:  2+ in the upper extremities and absent in the lower extremities. Plantars: upgoing bilaterally Cerebellar: Unable to perform        Laboratory Studies:   Basic Metabolic Panel:  Recent Labs Lab 02/26/16 1032 02/26/16 2140 02/27/16 0105 02/27/16 0525 02/27/16 1253  NA 145  --  143  --   --   K 4.9  --  4.4  --   --   CL 110  --  113*  --   --   CO2 30  --  22  --   --   GLUCOSE 89  --  102*  --   --   BUN 33*  --  26*  --   --   CREATININE 0.98  --  0.89  --   --   CALCIUM 9.3  --  8.7*  --   --   MG  --  1.8  --   --  1.8  PHOS  --   --   --  1.5* 2.4*    Liver Function Tests:  Recent Labs Lab 02/26/16 1032  AST 29  ALT 20  ALKPHOS 87  BILITOT 1.2  PROT 6.6  ALBUMIN 3.6   No results for input(s): LIPASE,  AMYLASE in the last 168 hours. No results for input(s): AMMONIA in the last 168 hours.  CBC:  Recent Labs Lab 02/26/16 1032 02/27/16 0105  WBC 9.6 10.7*  NEUTROABS 7.6* 8.3*  HGB 12.3* 11.5*  HCT 35.3* 33.1*  MCV 98.3 98.2  PLT 145* 136*  Cardiac Enzymes:  Recent Labs Lab 02/26/16 1032 02/26/16 2123 02/27/16 0105 02/27/16 0525 02/27/16 0951  TROPONINI <0.03 <0.03 <0.03 0.03* 0.03*    BNP: Invalid input(s): POCBNP  CBG:  Recent Labs Lab 02/26/16 2107  GLUCAP 113*    Microbiology: Results for orders placed or performed during the hospital encounter of 02/26/16  Urine culture     Status: Abnormal   Collection Time: 02/26/16 12:28 PM  Result Value Ref Range Status   Specimen Description URINE, RANDOM  Final   Special Requests NONE  Final   Culture >=100,000 COLONIES/mL VIRIDANS STREPTOCOCCUS (A)  Final   Report Status 02/27/2016 FINAL  Final  MRSA PCR Screening     Status: None   Collection Time: 02/26/16  9:12 PM  Result Value Ref Range Status   MRSA by PCR NEGATIVE NEGATIVE Final    Comment:        The GeneXpert MRSA Assay (FDA approved for NASAL specimens only), is one component of a comprehensive MRSA colonization surveillance program. It is not intended to diagnose MRSA infection nor to guide or monitor treatment for MRSA infections.     Coagulation Studies: No results for input(s): LABPROT, INR in the last 72 hours.  Urinalysis:  Recent Labs Lab 02/26/16 1210  COLORURINE AMBER*  LABSPEC 1.021  PHURINE 6.0  GLUCOSEU NEGATIVE  HGBUR NEGATIVE  BILIRUBINUR NEGATIVE  KETONESUR TRACE*  PROTEINUR 30*  NITRITE NEGATIVE  LEUKOCYTESUR NEGATIVE    Lipid Panel:     Component Value Date/Time   CHOL 133 02/26/2016 1032   TRIG 76 02/27/2016 0525   HDL 54 02/26/2016 1032   CHOLHDL 2.5 02/26/2016 1032   VLDL 8 02/26/2016 1032   LDLCALC 71 02/26/2016 1032    HgbA1C: No results found for: HGBA1C  Urine Drug Screen:  No results found  for: LABOPIA, COCAINSCRNUR, LABBENZ, AMPHETMU, THCU, LABBARB  Alcohol Level: No results for input(s): ETH in the last 168 hours.  Other results: EKG: sinus bradycardia at 49 bpm.  Imaging: Dg Abd 1 View  Result Date: 02/26/2016 CLINICAL DATA:  Orogastric tube placement. EXAM: ABDOMEN - 1 VIEW COMPARISON:  None. FINDINGS: The orogastric tube enters the stomach and terminates in the stomach body. Gas-filled stomach noted. Leads from defibrillator pad noted in the left upper quadrant. Lumbar spondylosis and degenerative disc disease. Visualized bowel gas pattern normal. IMPRESSION: Nasogastric tube tip is in the stomach body. Electronically Signed   By: Gaylyn RongWalter  Liebkemann M.D.   On: 02/26/2016 23:05   Ct Head Wo Contrast  Result Date: 02/26/2016 CLINICAL DATA:  Cardiac arrest with asystole. EXAM: CT HEAD WITHOUT CONTRAST TECHNIQUE: Contiguous axial images were obtained from the base of the skull through the vertex without intravenous contrast. COMPARISON:  02/26/2016 at 10:57 a.m. FINDINGS: Brain: Cerebral atrophy for age. Mild chronic ischemic microvascular white matter disease. Preserved gray-white junction without compelling CT findings of global anoxic event. Stable ex vacuo prominence of the ventricular system. No intracranial hemorrhage or mass lesion. Vascular: There is some atherosclerotic calcification of the dominant right vertebral artery. Skull: Unremarkable Sinuses/Orbits: There is some fluid dependently in the nasopharynx probably related to the oral intubation. Sinuses clear. Orbits unremarkable. Other: No supplemental non-categorized findings. IMPRESSION: 1. Chronically stable appearance of cerebral atrophy with ex vacuo prominence of the ventricular system. No findings of acute CVA, stigmata of recent global anoxic event, intracranial hemorrhage, or mass lesion identified. Electronically Signed   By: Gaylyn RongWalter  Liebkemann M.D.   On: 02/26/2016 22:50   Ct Head Wo  Contrast  Result Date:  02/26/2016 CLINICAL DATA:  Altered mental status. Upon awakening this am the pt was able to sit up on the side of the bed and seemed okay but when called breakfast he could not come. EXAM: CT HEAD WITHOUT CONTRAST TECHNIQUE: Contiguous axial images were obtained from the base of the skull through the vertex without intravenous contrast. COMPARISON:  10/31/2015, 05/20/2015 FINDINGS: Brain: No evidence of acute infarction, hemorrhage, hydrocephalus, extra-axial collection or mass lesion/mass effect. There is generalized cerebral atrophy. There is mild periventricular white matter low attenuation likely secondary to microvascular disease. Vascular: No hyperdense vessel or unexpected calcification. Skull: No osseous abnormality. Sinuses/Orbits: Visualized paranasal sinuses are clear. Visualized mastoid sinuses are clear. Visualized orbits demonstrate no focal abnormality. Other: None IMPRESSION: 1. No acute intracranial pathology. Electronically Signed   By: Elige Ko   On: 02/26/2016 11:07   Mr Brain Wo Contrast  Result Date: 02/27/2016 CLINICAL DATA:  Altered mental status. Patient is a long-term group home resident. Difficulty walking. EXAM: MRI HEAD WITHOUT CONTRAST TECHNIQUE: Multiplanar, multiecho pulse sequences of the brain and surrounding structures were obtained without intravenous contrast. COMPARISON:  CT head 02/26/2016.  MR head 01/04/2006. FINDINGS: The patient was unable to remain motionless for the exam. Small or subtle lesions could be overlooked. Brain: No acute infarction, hemorrhage, extra-axial collection or mass lesion. Global atrophy. The hydrocephalus ex vacuo. Mild white matter disease, predominantly periventricular, difficult to characterize with regard to chronic microvascular ischemic change versus transependymal absorption. Vascular: Flow voids are maintained throughout the carotid, basilar, and vertebral arteries. There are no areas of chronic hemorrhage. Skull and upper cervical  spine: Normal marrow signal. No tonsillar herniation. Sinuses/Orbits: Negative. 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 It is unclear if this was removed via some occult surgical approach or resolved spontaneously. IMPRESSION: Motion degraded exam demonstrating advanced atrophy with ventriculomegaly. No acute intracranial findings are evident. 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 Electronically Signed   By: Elsie Stain M.D.   On: 02/27/2016 13:00   Dg Chest Port 1 View  Result Date: 02/27/2016 CLINICAL DATA:  Intubation. EXAM: PORTABLE CHEST 1 VIEW COMPARISON:  02/26/2016 . FINDINGS: Endotracheal tube and NG tube in stable position. Mediastinum hilar structures are normal. Heart size stable. No focal infiltrate. No pleural effusion or pneumothorax. Degenerative changes thoracic spine. The degenerative changes again noted about both shoulders. IMPRESSION: 1.  Lines and tubes in stable position. 2.  No acute cardiopulmonary disease. Electronically Signed   By: Maisie Fus  Register   On: 02/27/2016 07:24   Dg Chest Port 1 View  Result Date: 02/26/2016 CLINICAL DATA:  Code blue, intubated EXAM: PORTABLE CHEST 1 VIEW COMPARISON:  02/21/2016 FINDINGS: Cardiomediastinal silhouette is stable. No acute infiltrate or pulmonary edema. Endotracheal tube in place with tip 2.5 cm above the carina. NG tube in place with tip in proximal stomach. There is no pneumothorax. Degenerative changes are noted left shoulder. IMPRESSION: No active disease. Endotracheal and NG tube in place. No pneumothorax. Electronically Signed   By: Natasha Mead M.D.   On: 02/26/2016 21:29   Dg Chest Port 1 View  Result Date: 02/26/2016 CLINICAL DATA:  Decreased activity level. Decreased communication, not walking like normal. EXAM: PORTABLE CHEST 1 VIEW COMPARISON:  Portable chest x-ray of September 16, 2015 FINDINGS: The lungs are adequately inflated and clear. The  heart and pulmonary vascularity are normal. The mediastinum is normal in width. There is  no pleural effusion. There is calcification in the wall of the aortic arch. There is considerable degenerative change of both shoulders with chronic inferior subluxation of the right humeral head with respect to the glenoid. IMPRESSION: There is no active cardiopulmonary disease. Aortic atherosclerosis. Chronic changes of both shoulders. Electronically Signed   By: David  [place] M.D.   On: 02/26/2016 14:29     Assessment/Plan: 65 year old male presenting with altered mental status.  Now intubated and sedated.  Unclear etiology for presentation.  Will rule out seizure.  Head CT personally reviewed and shows no acute changes.  MRI of the brain with artifact but shows no evidence of acute changes as well.  There is evidence of hydrocephalus which is longstanding and unchanged. If WBC count continues to increase with no source of infection apparent will consider LP.  Recommendations: 1.  EEG    Thana Farr, MD Neurology [phone] 02/27/2016, 4:35 PM

## 2016-02-27 NOTE — Progress Notes (Signed)
Pharmacy Antibiotic Note  Warren NeighborRalph M Blaze Jr. is a 65 y.o. male admitted on 02/26/2016 with respiratory failure - possible UTI/pneumonia.  Pharmacy has been consulted for piperacillin/tazobactam dosing.  This is day #2 of antibiotics.  Plan: Continue piperacillin/tazobactam 3.375 g IV q8h EI  Height: 6' (182.9 cm) Weight: 144 lb 8 oz (65.5 kg) IBW/kg (Calculated) : 77.6  Temp (24hrs), Avg:97.9 F (36.6 C), Min:97.6 F (36.4 C), Max:98.4 F (36.9 C)   Recent Labs Lab 02/26/16 1032 02/27/16 0105  WBC 9.6 10.7*  CREATININE 0.98 0.89    Estimated Creatinine Clearance: 76.7 mL/min (by C-G formula based on SCr of 0.89 mg/dL).    No Known Allergies  Antimicrobials this admission: Piperacillin/tazobactam 9/7 >>   Dose adjustments this admission:  Microbiology results: 9/7 UCx: Pending  9/7 MRSA PCR: Negative  Thank you for allowing pharmacy to be a part of this patient's care.  Cindi CarbonMary M Madalynn Pickelsimer, PharmD, BCPS Clinical Pharmacist 02/27/2016 2:33 PM

## 2016-02-27 NOTE — Progress Notes (Signed)
Initial Nutrition Assessment  DOCUMENTATION CODES:   Not applicable  INTERVENTION:  -TF: received verbal order from MD Ramachandran to begin TF. Recommend starting Vital High Protein at goal of 60 ml/hr (1440 kcals, 127 g protein and 1210 mL free water). PEPuP protocol initiated. Additional 204 kcals from D5 infusion at present. If diprivan restarted, will need to reassess appropriate goal rate   NUTRITION DIAGNOSIS:   Inadequate oral intake related to acute illness as evidenced by NPO status.   GOAL:   Provide needs based on ASPEN/SCCM guidelines  MONITOR:   TF tolerance, Labs, Weight trends  REASON FOR ASSESSMENT:   Ventilator, Consult Enteral/tube feeding initiation and management  ASSESSMENT:   65 yo male admitted with AMS. Pt beceme unresponsive with agonal respirations on 9/7, code blue called and pt required intubation  Patient is currently intubated on ventilator support, OG tube in stomach MV: 7.5 L/min Temp (24hrs), Avg:98 F (36.7 C), Min:97.6 F (36.4 C), Max:98.4 F (36.9 C)  Propofol:  Off at present, no plan to restart per Charli RN   Past Medical History:  Diagnosis Date  . Anxiety   . Bipolar 1 disorder (HCC)   . Bone disorder   . Depression   . GERD (gastroesophageal reflux disease)      Diet Order:  Diet NPO time specified  Skin:  Reviewed, no issues  Last BM:  no documented BM   Labs: phosphorus 1.5 (supplemented)  Meds: D5 at 50 ml/hr (204 kcals), diprivan  Height:   Ht Readings from Last 1 Encounters:  02/26/16 6' (1.829 m)    Weight:   Wt Readings from Last 1 Encounters:  02/26/16 144 lb 8 oz (65.5 kg)   Filed Weights   02/26/16 0946 02/26/16 1555  Weight: 165 lb (74.8 kg) 144 lb 8 oz (65.5 kg)    BMI:  Body mass index is 19.6 kg/m.  Estimated Nutritional Needs:   Kcal:  1594 kcals  Protein:  99-132 g  Fluid:  >/= 1.6 L  EDUCATION NEEDS:   No education needs identified at this time  Romelle StarcherCate Fabiha Rougeau MS, RD,  LDN [phone] Pager  [phone] Weekend/On-Call Pager

## 2016-02-27 NOTE — Progress Notes (Signed)
In handoff report Charli, RN relayed that Dr. Nicholos Johnsamachandran was made aware of SB in the low 40's and instructed that RN give atropine if HR sustains in 30's. Discussed parameters with Ms. Luci Bankukov, NP also discussed BP and other vitals. NP instructed RN to call if HR sustains in 30's. Will continue to monitor pt. Closely at this time.

## 2016-02-27 NOTE — Progress Notes (Signed)
Dr. Marchelle Gearingamaswamy and Lexine BatonBincy, NP made aware of pt HR remaining in the upper 40's to low 50's. Ordered by Dr. Jeronimo Greavesamasswamy to get 12 lead EKG to rule any blockages. Pt weaned down to 5 mcg on propofol. EKG just shown sinus bradycardia at 49. Bincy, NP notified of EKG results. Just told to monitor pt for now since BP is stable.

## 2016-02-28 DIAGNOSIS — R4182 Altered mental status, unspecified: Secondary | ICD-10-CM

## 2016-02-28 LAB — HEPARIN LEVEL (UNFRACTIONATED)
HEPARIN UNFRACTIONATED: 0.78 [IU]/mL — AB (ref 0.30–0.70)
HEPARIN UNFRACTIONATED: 0.71 [IU]/mL — AB (ref 0.30–0.70)
Heparin Unfractionated: 0.4 IU/mL (ref 0.30–0.70)

## 2016-02-28 LAB — COMPREHENSIVE METABOLIC PANEL
ALBUMIN: 3 g/dL — AB (ref 3.5–5.0)
ALT: 16 U/L — ABNORMAL LOW (ref 17–63)
ANION GAP: 6 (ref 5–15)
AST: 23 U/L (ref 15–41)
Alkaline Phosphatase: 77 U/L (ref 38–126)
BILIRUBIN TOTAL: 1 mg/dL (ref 0.3–1.2)
BUN: 19 mg/dL (ref 6–20)
CALCIUM: 8.3 mg/dL — AB (ref 8.9–10.3)
CO2: 28 mmol/L (ref 22–32)
Chloride: 105 mmol/L (ref 101–111)
Creatinine, Ser: 1.04 mg/dL (ref 0.61–1.24)
Glucose, Bld: 142 mg/dL — ABNORMAL HIGH (ref 65–99)
POTASSIUM: 3.1 mmol/L — AB (ref 3.5–5.1)
Sodium: 139 mmol/L (ref 135–145)
TOTAL PROTEIN: 5.7 g/dL — AB (ref 6.5–8.1)

## 2016-02-28 LAB — GLUCOSE, CAPILLARY
GLUCOSE-CAPILLARY: 96 mg/dL (ref 65–99)
GLUCOSE-CAPILLARY: 96 mg/dL (ref 65–99)
Glucose-Capillary: 110 mg/dL — ABNORMAL HIGH (ref 65–99)
Glucose-Capillary: 118 mg/dL — ABNORMAL HIGH (ref 65–99)
Glucose-Capillary: 96 mg/dL (ref 65–99)

## 2016-02-28 LAB — CBC
HEMATOCRIT: 32.9 % — AB (ref 40.0–52.0)
Hemoglobin: 11.5 g/dL — ABNORMAL LOW (ref 13.0–18.0)
MCH: 34 pg (ref 26.0–34.0)
MCHC: 35 g/dL (ref 32.0–36.0)
MCV: 97.3 fL (ref 80.0–100.0)
Platelets: 119 10*3/uL — ABNORMAL LOW (ref 150–440)
RBC: 3.38 MIL/uL — ABNORMAL LOW (ref 4.40–5.90)
RDW: 12.9 % (ref 11.5–14.5)
WBC: 8.1 10*3/uL (ref 3.8–10.6)

## 2016-02-28 LAB — MAGNESIUM
MAGNESIUM: 2.3 mg/dL (ref 1.7–2.4)
Magnesium: 2.3 mg/dL (ref 1.7–2.4)

## 2016-02-28 LAB — TROPONIN I
Troponin I: <0.03 ng/mL (ref <0.03)
Troponin I: <0.03 ng/mL (ref <0.03)

## 2016-02-28 LAB — PHOSPHORUS
PHOSPHORUS: 4 mg/dL (ref 2.5–4.6)
Phosphorus: 3.4 mg/dL (ref 2.5–4.6)

## 2016-02-28 MED ORDER — WARFARIN - PHARMACIST DOSING INPATIENT
Freq: Every day | Status: DC
  Administered 2016-02-29 – 2016-03-03 (×4)

## 2016-02-28 MED ORDER — HALOPERIDOL 5 MG PO TABS
5.0000 mg | ORAL_TABLET | Freq: Two times a day (BID) | ORAL | Status: DC
  Administered 2016-02-28 – 2016-03-04 (×10): 5 mg via ORAL
  Filled 2016-02-28 (×6): qty 1
  Filled 2016-02-28: qty 10
  Filled 2016-02-28 (×3): qty 1

## 2016-02-28 MED ORDER — POTASSIUM CHLORIDE 20 MEQ PO PACK
40.0000 meq | PACK | Freq: Once | ORAL | Status: AC
  Administered 2016-02-28: 40 meq
  Filled 2016-02-28: qty 2

## 2016-02-28 MED ORDER — WARFARIN SODIUM 5 MG PO TABS
5.0000 mg | ORAL_TABLET | Freq: Every day | ORAL | Status: DC
  Administered 2016-02-29: 5 mg via ORAL
  Filled 2016-02-28: qty 1

## 2016-02-28 NOTE — Progress Notes (Signed)
ANTICOAGULATION CONSULT NOTE - Initial Consult  Pharmacy Consult for Heparin  Indication: Left ventricular thrombus  No Known Allergies  Patient Measurements: Height: 6' (182.9 cm) Weight: 143 lb 8.3 oz (65.1 kg) IBW/kg (Calculated) : 77.6 Heparin Dosing Weight: 65.5 kg   Vital Signs: Temp: 97.2 F (36.2 C) (09/09 0400) Temp Source: Axillary (09/09 0400) BP: 119/81 (09/09 1200) Pulse Rate: 68 (09/09 1200)  Labs:  Recent Labs  02/26/16 1032  02/27/16 0105  02/27/16 1658 02/27/16 2248 02/28/16 0405 02/28/16 0711 02/28/16 0955 02/28/16 1354  HGB 12.3*  --  11.5*  --   --   --  11.5*  --   --   --   HCT 35.3*  --  33.1*  --   --   --  32.9*  --   --   --   PLT 145*  --  136*  --   --   --  119*  --   --   --   APTT  --   --   --   --  40*  --   --   --   --   --   LABPROT  --   --   --   --  14.5  --   --   --   --   --   INR  --   --   --   --  1.12  --   --   --   --   --   HEPARINUNFRC  --   --   --   --   --  1.02*  --  0.78*  --  0.71*  CREATININE 0.98  --  0.89  --   --   --  1.04  --   --   --   TROPONINI <0.03  < > <0.03  < >  --  <0.03 <0.03  --  <0.03  --   < > = values in this interval not displayed.  Estimated Creatinine Clearance: 65.2 mL/min (by C-G formula based on SCr of 1.04 mg/dL).   Medical History: Past Medical History:  Diagnosis Date  . Anxiety   . Bipolar 1 disorder (HCC)   . Bone disorder   . Depression   . GERD (gastroesophageal reflux disease)     Medications:  Scheduled:  . atorvastatin  40 mg Oral q1800  . benztropine  1 mg Oral QHS  . chlorhexidine  15 mL Mouth Rinse BID  . chlorproMAZINE  50 mg Oral BID  . memantine  28 mg Oral Daily   And  . donepezil  10 mg Oral Daily  . escitalopram  10 mg Oral Daily  . famotidine (PEPCID) IV  20 mg Intravenous Q12H  . fentaNYL (SUBLIMAZE) injection  50 mcg Intravenous Once  . haloperidol  5 mg Oral BID  . levothyroxine  25 mcg Oral BH-q7a  . mouth rinse  15 mL Mouth Rinse 10 times per  day  . omega-3 acid ethyl esters  1 g Oral Daily  . piperacillin-tazobactam (ZOSYN)  IV  3.375 g Intravenous Q8H  . risperiDONE  3 mg Oral BID  . sodium chloride flush  3 mL Intravenous Q12H  . Vitamin D (Ergocalciferol)  50,000 Units Oral Q7 days  . warfarin  5 mg Oral q1800  . Warfarin - Pharmacist Dosing Inpatient   Does not apply q1800    Assessment: Pharmacy consulted to dose heparin in this 65 year old male with  left ventricular thrombus.  Pt was previously on lovenox 40 mg SQ Q24H, now d/c'd.  Last dose was on 9/7 @ 22:52.  Goal of Therapy:  Heparin level 0.3-0.7 units/ml Monitor platelets by anticoagulation protocol: Yes   Plan:  Current orders for heparin 800 units/hr.  Heparin level above goal, will decrease to 700 units/hr and recheck heparin level in 6 hours. CBC with AM labs  Revella Shelton C 02/28/2016,3:04 PM

## 2016-02-28 NOTE — Progress Notes (Signed)
ANTICOAGULATION CONSULT NOTE - Initial Consult  Pharmacy Consult for Heparin  Indication: Left ventricular thrombus  No Known Allergies  Patient Measurements: Height: 6' (182.9 cm) Weight: 143 lb 8.3 oz (65.1 kg) IBW/kg (Calculated) : 77.6 Heparin Dosing Weight: 65.5 kg   Vital Signs: Temp: 97.2 F (36.2 C) (09/09 0400) Temp Source: Axillary (09/09 0400) BP: 120/65 (09/09 0700) Pulse Rate: 58 (09/09 0700)  Labs:  Recent Labs  02/26/16 1032  02/27/16 0105  02/27/16 0951 02/27/16 1658 02/27/16 2248 02/28/16 0405 02/28/16 0711  HGB 12.3*  --  11.5*  --   --   --   --  11.5*  --   HCT 35.3*  --  33.1*  --   --   --   --  32.9*  --   PLT 145*  --  136*  --   --   --   --  119*  --   APTT  --   --   --   --   --  40*  --   --   --   LABPROT  --   --   --   --   --  14.5  --   --   --   INR  --   --   --   --   --  1.12  --   --   --   HEPARINUNFRC  --   --   --   --   --   --  1.02*  --  0.78*  CREATININE 0.98  --  0.89  --   --   --   --  1.04  --   TROPONINI <0.03  < > <0.03  < > 0.03*  --  <0.03 <0.03  --   < > = values in this interval not displayed.  Estimated Creatinine Clearance: 65.2 mL/min (by C-G formula based on SCr of 1.04 mg/dL).   Medical History: Past Medical History:  Diagnosis Date  . Anxiety   . Bipolar 1 disorder (HCC)   . Bone disorder   . Depression   . GERD (gastroesophageal reflux disease)     Medications:  Scheduled:  . atorvastatin  40 mg Oral q1800  . benztropine  1 mg Oral QHS  . chlorhexidine  15 mL Mouth Rinse BID  . chlorproMAZINE  50 mg Oral BID  . memantine  28 mg Oral Daily   And  . donepezil  10 mg Oral Daily  . escitalopram  10 mg Oral Daily  . famotidine (PEPCID) IV  20 mg Intravenous Q12H  . fentaNYL (SUBLIMAZE) injection  50 mcg Intravenous Once  . haloperidol  5 mg Oral BID  . levothyroxine  25 mcg Oral BH-q7a  . mouth rinse  15 mL Mouth Rinse 10 times per day  . omega-3 acid ethyl esters  1 g Oral Daily  .  piperacillin-tazobactam (ZOSYN)  IV  3.375 g Intravenous Q8H  . risperiDONE  3 mg Oral BID  . sodium chloride flush  3 mL Intravenous Q12H  . Vitamin D (Ergocalciferol)  50,000 Units Oral Q7 days    Assessment: Pharmacy consulted to dose heparin in this 65 year old male with left ventricular thrombus.  Pt was previously on lovenox 40 mg SQ Q24H, now d/c'd.  Last dose was on 9/7 @ 22:52.  Goal of Therapy:  Heparin level 0.3-0.7 units/ml Monitor platelets by anticoagulation protocol: Yes   Plan:  Current orders for heparin 900 units/hr.  Heparin level above goal, will decrease to 800 units/hr and recheck heparin level in 6 hours. CBC with AM labs  Hays Dunnigan C 02/28/2016,7:54 AM

## 2016-02-28 NOTE — Progress Notes (Signed)
Patient is being transferred to 243.  Report was given to Greer EeSusan Presto, RN

## 2016-02-28 NOTE — Progress Notes (Signed)
Patient is transferred to room 243 by Arther DamesLeslie RN and a NT. No acute distress noted. Patient denies any acute pain at this time. Will continue to monitor. Tele box called to CCMD with Shanda BumpsJessica NT as a second verifier.

## 2016-02-28 NOTE — Progress Notes (Addendum)
ANTICOAGULATION CONSULT NOTE - Initial Consult  Pharmacy Consult for Heparin  Indication: Left ventricular thrombus  No Known Allergies  Patient Measurements: Height: 6' (182.9 cm) Weight: 153 lb 12.8 oz (69.8 kg) IBW/kg (Calculated) : 77.6 Heparin Dosing Weight: 65.5 kg   Vital Signs: Temp: 97.8 F (36.6 C) (09/09 1943) Temp Source: Oral (09/09 1943) BP: 143/52 (09/09 1943) Pulse Rate: 66 (09/09 1943)  Labs:  Recent Labs  02/26/16 1032  02/27/16 0105  02/27/16 1658  02/27/16 2248 02/28/16 0405 02/28/16 0711 02/28/16 0955 02/28/16 1354 02/28/16 2106  HGB 12.3*  --  11.5*  --   --   --   --  11.5*  --   --   --   --   HCT 35.3*  --  33.1*  --   --   --   --  32.9*  --   --   --   --   PLT 145*  --  136*  --   --   --   --  119*  --   --   --   --   APTT  --   --   --   --  40*  --   --   --   --   --   --   --   LABPROT  --   --   --   --  14.5  --   --   --   --   --   --   --   INR  --   --   --   --  1.12  --   --   --   --   --   --   --   HEPARINUNFRC  --   --   --   --   --   < > 1.02*  --  0.78*  --  0.71* 0.40  CREATININE 0.98  --  0.89  --   --   --   --  1.04  --   --   --   --   TROPONINI <0.03  < > <0.03  < >  --   --  <0.03 <0.03  --  <0.03  --   --   < > = values in this interval not displayed.  Estimated Creatinine Clearance: 69.9 mL/min (by C-G formula based on SCr of 1.04 mg/dL).   Medical History: Past Medical History:  Diagnosis Date  . Anxiety   . Bipolar 1 disorder (HCC)   . Bone disorder   . Depression   . GERD (gastroesophageal reflux disease)     Medications:  Scheduled:  . atorvastatin  40 mg Oral q1800  . benztropine  1 mg Oral QHS  . chlorhexidine  15 mL Mouth Rinse BID  . chlorproMAZINE  50 mg Oral BID  . memantine  28 mg Oral Daily   And  . donepezil  10 mg Oral Daily  . escitalopram  10 mg Oral Daily  . famotidine (PEPCID) IV  20 mg Intravenous Q12H  . fentaNYL (SUBLIMAZE) injection  50 mcg Intravenous Once  .  haloperidol  5 mg Oral BID  . levothyroxine  25 mcg Oral BH-q7a  . mouth rinse  15 mL Mouth Rinse 10 times per day  . omega-3 acid ethyl esters  1 g Oral Daily  . piperacillin-tazobactam (ZOSYN)  IV  3.375 g Intravenous Q8H  . risperiDONE  3 mg Oral BID  . sodium  chloride flush  3 mL Intravenous Q12H  . Vitamin D (Ergocalciferol)  50,000 Units Oral Q7 days  . warfarin  5 mg Oral q1800  . Warfarin - Pharmacist Dosing Inpatient   Does not apply q1800    Assessment: Pharmacy consulted to dose heparin in this 65 year old male with left ventricular thrombus.  Pt was previously on lovenox 40 mg SQ Q24H, now d/c'd.  Last dose was on 9/7 @ 22:52.  Goal of Therapy:  Heparin level 0.3-0.7 units/ml Monitor platelets by anticoagulation protocol: Yes   Plan:  Heparin level at gaol 0.4. Continue drip at 700 units/hr and recheck heparin level in 6 hours. CBC with AM labs  9/10 AM heparin level 0.22. 1000 unit bolus and increase rate to 850 units/hr. Recheck in 6 hours.  Melissa D Maccia 02/28/2016,9:52 PM

## 2016-02-28 NOTE — Progress Notes (Addendum)
[facility] Critical Care Medicine Progess Note    ASSESSMENT/PLAN    DISCUSSION: 65 year old male came with altered mental status. Was in Asystole, momentarily, ROSC . HR was in 84. Intubated for agonal respirations. Now seizing. [facility] consulted.   Attestation: 65 year old male, transferred to the ICU with mental status appropriate problems. Since the arrival to the ICU, the patient has had movements of the head which were thought to be seizures, the patient was subsequently loaded with Keppra. EEG was obtained, thus far this shows no evidence of seizure activity, was some background slowing and movements of the head were noted. Subsequently, the same patient's mental status  is significant only improved, he is awake, and answering questions on the ventilator. Physical exam is unremarkable. Vital signs have stabilized. We'll check a weaning trial and extubate the patient if tolerated. Cardiology. Recommendations noted, patient is currently on IV heparin for an LV thrombus. We'll start the patient on Coumadin. Left side facial and tongue deviation were noted when the patient had the episode of unresponsiveness. Given the finding of an LV thrombus, I suspect the initial insult was a cardio-embolic stroke, which he now seems to have recovered from.   Warren [name], M.D.  02/28/2016 Critical Care Attestation.  I have personally obtained a history, examined the patient, evaluated laboratory and imaging results, formulated the assessment and plan and placed orders. The Patient requires high complexity decision making for assessment and support, frequent evaluation and titration of therapies, application of advanced monitoring technologies and extensive interpretation of multiple databases. The patient has critical illness that could lead imminently to failure of 1 or more organ systems and requires the highest level of physician preparedness to intervene.  Critical Care  Time devoted to patient care services described in this note is 40 minutes and is exclusive of time spent in procedures.   ASSESSMENT / PLAN:  PULMONARY A: Acute respiratory failure Vent: PRVC/450/16/5/40%  P:   Continue vent support Fentanyl/Versed Routine ABGs CXR in a.m. PSV trials this am  CARDIOVASCULAR A:  PEA Cardiac arrest Hyperlipidemia LV thrombus P:  continuous telemetry Keep MAP goals>65 Trend troponins Continue lipitor STAT EKG reviewed-No ischemic changes Continue heparin, start Coumadin.  RENAL A:   No active issues Na High normal P:   Replace electrolytes per ICU protocol D5W  GASTROINTESTINAL A:   Hx of GERD P:   Continue Pepcid  HEMATOLOGIC A:   No active issues P:  lovenox for DVT prophylaxis  INFECTIOUS A:   HCAP P:   Continue Zosyn   CULTURES: 9/7 urine culture>>  ANTIBIOTICS: 9/7 Zosyn>>  ENDOCRINE A:   NO Active issues P:   Blood sugar checks with BMP  NEUROLOGIC A:   Acute encephalopathy History of bipolar disorder History of depression left side facial and tongue deviation was noted when the patient had the episode of unresponsiveness. Given the finding of an LV thrombus, I suspect the initial insult was a stroke. P:   RASS goal:0 to -1 Lorazepam PRN Loaded with Kepra Neurology consulted  STUDIES:  9/7 CT head WO contrast>>No findings of acute CVA,stigmata of recent global anoxic event, intracranial hemorrhage, ormass lesion identified.  9/7: Transthoracic echocardiogram:Borderline left ventricle systolic function, with rounded opacity in the apex of the left ventricle probably represents thrombus. Advise anticoagulation with Coumadin.  9/8: ZOX:WRUEEEG:This is an abnormal electroencephalogram due to general background slowing.  There is no evidence of epileptiform activity.  Facial and head movements are noted without any epileptiform activity noted.  9/8 MRI: no significant findings, but degraded by  motion artefact.   SIGNIFICANT EVENTS: 9/7 patient admitted to the ICU post cardiac arrest. Intubated and mechanically ventilated.  LINES/TUBES: none  Best Practices  DVT Prophylaxis: heparin GI Prophylaxis: --   ----------------------------------------   Name: Warren [name]. MRN: [id number] DOB: 30-Oct-1950    ADMISSION DATE:  02/26/2016     SUBJECTIVE: No acute issues overnight. Patient is awake and wants ETT out. Plan is to initiate PSV in am  Review of Systems:  Pt currently on the ventilator, can not provide history or review of systems.    VITAL SIGNS: Temp:  [97.2 F (36.2 C)-98.6 F (37 C)] 97.2 F (36.2 C) (09/09 0400) Pulse Rate:  [47-72] 51 (09/09 0400) Resp:  [14-18] 16 (09/09 0400) BP: (103-148)/(53-86) 137/66 (09/09 0400) SpO2:  [100 %] 100 % (09/09 0400) FiO2 (%):  [30 %-50 %] 30 % (09/09 0400) Weight:  [143 lb 8.3 oz (65.1 kg)] 143 lb 8.3 oz (65.1 kg) (09/09 0422) HEMODYNAMICS:   VENTILATOR SETTINGS: Vent Mode: PRVC FiO2 (%):  [30 %-50 %] 30 % Set Rate:  [16 bmp] 16 bmp Vt Set:  [450 mL] 450 mL PEEP:  [5 cmH20] 5 cmH20 Plateau Pressure:  [15 cmH20-16 cmH20] 15 cmH20 INTAKE / OUTPUT:  Intake/Output Summary (Last 24 hours) at 02/28/16 0431 Last data filed at 02/28/16 0400  Gross per 24 hour  Intake          2926.53 ml  Output             2185 ml  Net           741.53 ml    PHYSICAL EXAMINATION: Physical Examination:   VS: BP 137/66 (BP Location: Right Arm)   Pulse (!) 51   Temp 97.2 F (36.2 C) (Axillary)   Resp 16   Ht 6' (1.829 m)   Wt 143 lb 8.3 oz (65.1 kg)   SpO2 100%   BMI 19.46 kg/m   General Appearance: No distress  Neuro:without focal findings, mental status normal. HEENT: PERRLA, EOM intact. Pulmonary: normal breath sounds   CardiovascularNormal S1,S2.  No m/r/g.   Abdomen: Benign, Soft, non-tender. Renal:  No costovertebral tenderness  GU:  Not performed at this time. Endocrine: No evident thyromegaly. Skin:    warm, no rashes, no ecchymosis  Extremities: normal, no cyanosis, clubbing.   LABS:   LABORATORY PANEL:   CBC  Recent Labs Lab 02/28/16 0405  WBC 8.1  HGB 11.5*  HCT 32.9*  PLT 119*    Chemistries   Recent Labs Lab 02/26/16 1032  02/27/16 0105  02/27/16 1658  NA 145  --  143  --   --   K 4.9  --  4.4  --   --   CL 110  --  113*  --   --   CO2 30  --  22  --   --   GLUCOSE 89  --  102*  --   --   BUN 33*  --  26*  --   --   CREATININE 0.98  --  0.89  --   --   CALCIUM 9.3  --  8.7*  --   --   MG  --   < >  --   < > 1.9  PHOS  --   --   --   < > 3.4  AST 29  --   --   --   --  ALT 20  --   --   --   --   ALKPHOS 87  --   --   --   --   BILITOT 1.2  --   --   --   --   < > = values in this interval not displayed.   Recent Labs Lab 02/26/16 2107 02/27/16 1659 02/27/16 1935 02/28/16 0001 02/28/16 0339  GLUCAP 113* 100* 94 118* 96    Recent Labs Lab 02/27/16 0500  PHART 7.53*  PCO2ART 35  PO2ART 256*    Recent Labs Lab 02/26/16 1032  AST 29  ALT 20  ALKPHOS 87  BILITOT 1.2  ALBUMIN 3.6    Cardiac Enzymes  Recent Labs Lab 02/27/16 2248  TROPONINI <0.03    RADIOLOGY:  Dg Abd 1 View  Result Date: 02/26/2016 CLINICAL DATA:  Orogastric tube placement. EXAM: ABDOMEN - 1 VIEW COMPARISON:  None. FINDINGS: The orogastric tube enters the stomach and terminates in the stomach body. Gas-filled stomach noted. Leads from defibrillator pad noted in the left upper quadrant. Lumbar spondylosis and degenerative disc disease. Visualized bowel gas pattern normal. IMPRESSION: Nasogastric tube tip is in the stomach body. Electronically Signed   By: Gaylyn Rong M.D.   On: 02/26/2016 23:05   Ct Head Wo Contrast  Result Date: 02/26/2016 CLINICAL DATA:  Cardiac arrest with asystole. EXAM: CT HEAD WITHOUT CONTRAST TECHNIQUE: Contiguous axial images were obtained from the base of the skull through the vertex without intravenous contrast. COMPARISON:   02/26/2016 at 10:57 a.m. FINDINGS: Brain: Cerebral atrophy for age. Mild chronic ischemic microvascular white matter disease. Preserved gray-white junction without compelling CT findings of global anoxic event. Stable ex vacuo prominence of the ventricular system. No intracranial hemorrhage or mass lesion. Vascular: There is some atherosclerotic calcification of the dominant right vertebral artery. Skull: Unremarkable Sinuses/Orbits: There is some fluid dependently in the nasopharynx probably related to the oral intubation. Sinuses clear. Orbits unremarkable. Other: No supplemental non-categorized findings. IMPRESSION: 1. Chronically stable appearance of cerebral atrophy with ex vacuo prominence of the ventricular system. No findings of acute CVA, stigmata of recent global anoxic event, intracranial hemorrhage, or mass lesion identified. Electronically Signed   By: Gaylyn Rong M.D.   On: 02/26/2016 22:50   Ct Head Wo Contrast  Result Date: 02/26/2016 CLINICAL DATA:  Altered mental status. Upon awakening this am the pt was able to sit up on the side of the bed and seemed okay but when called breakfast he could not come. EXAM: CT HEAD WITHOUT CONTRAST TECHNIQUE: Contiguous axial images were obtained from the base of the skull through the vertex without intravenous contrast. COMPARISON:  10/31/2015, 05/20/2015 FINDINGS: Brain: No evidence of acute infarction, hemorrhage, hydrocephalus, extra-axial collection or mass lesion/mass effect. There is generalized cerebral atrophy. There is mild periventricular white matter low attenuation likely secondary to microvascular disease. Vascular: No hyperdense vessel or unexpected calcification. Skull: No osseous abnormality. Sinuses/Orbits: Visualized paranasal sinuses are clear. Visualized mastoid sinuses are clear. Visualized orbits demonstrate no focal abnormality. Other: None IMPRESSION: 1. No acute intracranial pathology. Electronically Signed   By: Elige Ko    On: 02/26/2016 11:07   Mr Brain Wo Contrast  Result Date: 02/27/2016 CLINICAL DATA:  Altered mental status. Patient is a long-term group home resident. Difficulty walking. EXAM: MRI HEAD WITHOUT CONTRAST TECHNIQUE: Multiplanar, multiecho pulse sequences of the brain and surrounding structures were obtained without intravenous contrast. COMPARISON:  CT head 02/26/2016.  MR head 01/04/2006. FINDINGS: The patient was  unable to remain motionless for the exam. Small or subtle lesions could be overlooked. Brain: No acute infarction, hemorrhage, extra-axial collection or mass lesion. Global atrophy. The hydrocephalus ex vacuo. Mild white matter disease, predominantly periventricular, difficult to characterize with regard to chronic microvascular ischemic change versus transependymal absorption. Vascular: Flow voids are maintained throughout the carotid, basilar, and vertebral arteries. There are no areas of chronic hemorrhage. Skull and upper cervical spine: Normal marrow signal. No tonsillar herniation. Sinuses/Orbits: Negative. 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 It is unclear if this was removed via some occult surgical approach or resolved spontaneously. IMPRESSION: Motion degraded exam demonstrating advanced atrophy with ventriculomegaly. No acute intracranial findings are evident. 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 Electronically Signed   By: Elsie Stain M.D.   On: 02/27/2016 13:00   Dg Chest Port 1 View  Result Date: 02/27/2016 CLINICAL DATA:  Intubation. EXAM: PORTABLE CHEST 1 VIEW COMPARISON:  02/26/2016 . FINDINGS: Endotracheal tube and NG tube in stable position. Mediastinum hilar structures are normal. Heart size stable. No focal infiltrate. No pleural effusion or pneumothorax. Degenerative changes thoracic spine. The degenerative changes again noted about both shoulders. IMPRESSION: 1.  Lines and tubes in stable  position. 2.  No acute cardiopulmonary disease. Electronically Signed   By: Maisie Fus  Register   On: 02/27/2016 07:24   Dg Chest Port 1 View  Result Date: 02/26/2016 CLINICAL DATA:  Code blue, intubated EXAM: PORTABLE CHEST 1 VIEW COMPARISON:  02/21/2016 FINDINGS: Cardiomediastinal silhouette is stable. No acute infiltrate or pulmonary edema. Endotracheal tube in place with tip 2.5 cm above the carina. NG tube in place with tip in proximal stomach. There is no pneumothorax. Degenerative changes are noted left shoulder. IMPRESSION: No active disease. Endotracheal and NG tube in place. No pneumothorax. Electronically Signed   By: Natasha Mead M.D.   On: 02/26/2016 21:29   Dg Chest Port 1 View  Result Date: 02/26/2016 CLINICAL DATA:  Decreased activity level. Decreased communication, not walking like normal. EXAM: PORTABLE CHEST 1 VIEW COMPARISON:  Portable chest x-ray of September 16, 2015 FINDINGS: The lungs are adequately inflated and clear. The heart and pulmonary vascularity are normal. The mediastinum is normal in width. There is no pleural effusion. There is calcification in the wall of the aortic arch. There is considerable degenerative change of both shoulders with chronic inferior subluxation of the right humeral head with respect to the glenoid. IMPRESSION: There is no active cardiopulmonary disease. Aortic atherosclerosis. Chronic changes of both shoulders. Electronically Signed   By: David  [place] M.D.   On: 02/26/2016 14:29    Total CCM time=40 minutes  [facility] ANP-BC Pulmonary and Critical Care Medicine [facility] Pager [phone] or [phone]  02/28/2016

## 2016-02-28 NOTE — Clinical Social Work Note (Signed)
Clinical Social Work Assessment  Patient Details  Name: Warren NeighborRalph M Napierkowski Jr. MRN: [id number] Date of Birth: 03/04/1951  Date of referral:  02/28/16               Reason for consult:  Facility Placement                Permission sought to share information with:  Facility Medical sales representativeContact Representative Permission granted to share information::  Yes, Release of Information Signed  Name::     Creed Coppernthony [facility]  [facility]  Relationship::  Care Manager  Contact Information:  [phone]  Housing/Transportation Living arrangements for the past 2 months:  Group Home Source of Information:  Facility Patient Interpreter Needed:  None Criminal Activity/Legal Involvement Pertinent to Current Situation/Hospitalization:  No - Comment as needed Significant Relationships:  Mental Health Provider Lives with:  Facility Resident Do you feel safe going back to the place where you live?  Yes Need for family participation in patient care:  Yes (Comment)  Care giving concerns:  Assessment for potential STR   Social Worker assessment / plan:  Patient was sleeping when CSW visited bedside. Patient's facility rep, Creed CopperAnthony Moore, provided information via telephone.  Patient is baseline independent with ADLs with chronic mental illness. Patient's hands are baseline impaired. [name] indicated that STR would not be necessary according to meeting with medical team. [name] reported that the discussion can be revisited if status changes.  CSW will con't to follow.  Employment status:  Disabled (Comment on whether or not currently receiving Disability) (Receives disability check) Insurance information:  Medicare PT Recommendations:  Not assessed at this time Information / Referral to community resources:     Patient/Family's Response to care:  Facility representative was pleasant and thanked CSW for follow-up  Patient/Family's Understanding of and Emotional Response to Diagnosis, Current Treatment, and  Prognosis:  Facility rep was able to verbalize in his own terms the information dispensed by the medical team.   Emotional Assessment Appearance:  Appears stated age Attitude/Demeanor/Rapport:   (Sleeping) Affect (typically observed):   (Sleeping) Orientation:   (Sleeping) Alcohol / Substance use:  Never Used Psych involvement (Current and /or in the community):  Yes (Comment)  Discharge Needs  Concerns to be addressed:  Discharge Planning Concerns Readmission within the last 30 days:  No Current discharge risk:  Psychiatric Illness Barriers to Discharge:  Continued Medical Work up   [employer] CorporationKaren M Ahmia Colford, LCSW 02/28/2016, 1:28 PM

## 2016-02-28 NOTE — Progress Notes (Signed)
SUBJECTIVE: Patient got extubated this morning and tolerating very well and the caretaker says that he just was not himself when he was brought to the hospital.   Vitals:   02/28/16 0422 02/28/16 0500 02/28/16 0600 02/28/16 0700  BP:  118/60 121/68 120/65  Pulse:  (!) 57 (!) 58 (!) 58  Resp:  16 13 (!) 9  Temp:      TempSrc:      SpO2:  100% 100% 100%  Weight: 143 lb 8.3 oz (65.1 kg)     Height:        Intake/Output Summary (Last 24 hours) at 02/28/16 1100 Last data filed at 02/28/16 0800  Gross per 24 hour  Intake          2741.08 ml  Output             2085 ml  Net           656.08 ml    LABS: Basic Metabolic Panel:  Recent Labs  13/24/4008/02/05 0105  02/27/16 1658 02/28/16 0405  NA 143  --   --  139  K 4.4  --   --  3.1*  CL 113*  --   --  105  CO2 22  --   --  28  GLUCOSE 102*  --   --  142*  BUN 26*  --   --  19  CREATININE 0.89  --   --  1.04  CALCIUM 8.7*  --   --  8.3*  MG  --   < > 1.9 2.3  PHOS  --   < > 3.4 4.0  < > = values in this interval not displayed. Liver Function Tests:  Recent Labs  02/26/16 1032 02/28/16 0405  AST 29 23  ALT 20 16*  ALKPHOS 87 77  BILITOT 1.2 1.0  PROT 6.6 5.7*  ALBUMIN 3.6 3.0*   No results for input(s): LIPASE, AMYLASE in the last 72 hours. CBC:  Recent Labs  02/26/16 1032 02/27/16 0105 02/28/16 0405  WBC 9.6 10.7* 8.1  NEUTROABS 7.6* 8.3*  --   HGB 12.3* 11.5* 11.5*  HCT 35.3* 33.1* 32.9*  MCV 98.3 98.2 97.3  PLT 145* 136* 119*   Cardiac Enzymes:  Recent Labs  02/27/16 2248 02/28/16 0405 02/28/16 0955  TROPONINI <0.03 <0.03 <0.03   BNP: Invalid input(s): POCBNP D-Dimer: No results for input(s): DDIMER in the last 72 hours. Hemoglobin A1C: No results for input(s): HGBA1C in the last 72 hours. Fasting Lipid Panel:  Recent Labs  02/26/16 1032 02/27/16 0525  CHOL 133  --   HDL 54  --   LDLCALC 71  --   TRIG 39 76  CHOLHDL 2.5  --    Thyroid Function Tests: No results for input(s): TSH,  T4TOTAL, T3FREE, THYROIDAB in the last 72 hours.  Invalid input(s): FREET3 Anemia Panel: No results for input(s): VITAMINB12, FOLATE, FERRITIN, TIBC, IRON, RETICCTPCT in the last 72 hours.   PHYSICAL EXAM General: Well developed, well nourished, in no acute distress HEENT:  Normocephalic and atramatic Neck:  No JVD.  Lungs: Clear bilaterally to auscultation and percussion. Heart: HRRR . Normal S1 and S2 without gallops or murmurs.  Abdomen: Bowel sounds are positive, abdomen soft and non-tender  Msk:  Back normal, normal gait. Normal strength and tone for age. Extremities: No clubbing, cyanosis or edema.   Neuro: Alert and oriented X 3. Psych:  Good affect, responds appropriately  TELEMETRY: Sinus bradycardia 55 bpm  ASSESSMENT  AND PLAN: Left ventricular apical thrombus with altered mental status. Advise changing heparin to Coumadin and keep INR between 2.5 and 3.  Principal Problem:   Altered mental status Active Problems:   Respiratory failure (HCC)    Jatoria Kneeland A, MD, [facility] 02/28/2016 11:00 AM

## 2016-02-28 NOTE — Progress Notes (Signed)
Pharmacy Antibiotic Note  Warren NeighborRalph M Scheff Jr. is a 65 y.o. male admitted on 02/26/2016 with respiratory failure - possible UTI/pneumonia.  Pharmacy has been consulted for piperacillin/tazobactam dosing.  This is day #2 of antibiotics.  Plan: Continue piperacillin/tazobactam 3.375 g IV q8h EI  Height: 6' (182.9 cm) Weight: 143 lb 8.3 oz (65.1 kg) IBW/kg (Calculated) : 77.6  Temp (24hrs), Avg:97.9 F (36.6 C), Min:97.2 F (36.2 C), Max:98.6 F (37 C)   Recent Labs Lab 02/26/16 1032 02/27/16 0105 02/28/16 0405  WBC 9.6 10.7* 8.1  CREATININE 0.98 0.89 1.04    Estimated Creatinine Clearance: 65.2 mL/min (by C-G formula based on SCr of 1.04 mg/dL).    No Known Allergies  Antimicrobials this admission: Piperacillin/tazobactam 9/7 >>   Dose adjustments this admission:  Microbiology results: 9/7 UCx: > 100k viridans strep 9/7 MRSA PCR: Negative  Thank you for allowing pharmacy to be a part of this patient's care.  Martyn MalayBarefoot,Benecio Kluger C, PharmD Clinical Pharmacist 02/28/2016 8:16 AM

## 2016-02-28 NOTE — Progress Notes (Signed)
ANTICOAGULATION CONSULT NOTE - Initial Consult  Pharmacy Consult for Heparin/Coumadin Indication: Left ventricular thrombus  No Known Allergies  Patient Measurements: Height: 6' (182.9 cm) Weight: 143 lb 8.3 oz (65.1 kg) IBW/kg (Calculated) : 77.6 Heparin Dosing Weight: 65.5 kg   Vital Signs: Temp: 97.2 F (36.2 C) (09/09 0400) Temp Source: Axillary (09/09 0400) BP: 119/81 (09/09 1200) Pulse Rate: 68 (09/09 1200)  Labs:  Recent Labs  02/26/16 1032  02/27/16 0105  02/27/16 1658 02/27/16 2248 02/28/16 0405 02/28/16 0711 02/28/16 0955  HGB 12.3*  --  11.5*  --   --   --  11.5*  --   --   HCT 35.3*  --  33.1*  --   --   --  32.9*  --   --   PLT 145*  --  136*  --   --   --  119*  --   --   APTT  --   --   --   --  40*  --   --   --   --   LABPROT  --   --   --   --  14.5  --   --   --   --   INR  --   --   --   --  1.12  --   --   --   --   HEPARINUNFRC  --   --   --   --   --  1.02*  --  0.78*  --   CREATININE 0.98  --  0.89  --   --   --  1.04  --   --   TROPONINI <0.03  < > <0.03  < >  --  <0.03 <0.03  --  <0.03  < > = values in this interval not displayed.  Estimated Creatinine Clearance: 65.2 mL/min (by C-G formula based on SCr of 1.04 mg/dL).   Medical History: Past Medical History:  Diagnosis Date  . Anxiety   . Bipolar 1 disorder (HCC)   . Bone disorder   . Depression   . GERD (gastroesophageal reflux disease)     Medications:  Scheduled:  . atorvastatin  40 mg Oral q1800  . benztropine  1 mg Oral QHS  . chlorhexidine  15 mL Mouth Rinse BID  . chlorproMAZINE  50 mg Oral BID  . memantine  28 mg Oral Daily   And  . donepezil  10 mg Oral Daily  . escitalopram  10 mg Oral Daily  . famotidine (PEPCID) IV  20 mg Intravenous Q12H  . fentaNYL (SUBLIMAZE) injection  50 mcg Intravenous Once  . haloperidol  5 mg Oral BID  . levothyroxine  25 mcg Oral BH-q7a  . mouth rinse  15 mL Mouth Rinse 10 times per day  . omega-3 acid ethyl esters  1 g Oral Daily   . piperacillin-tazobactam (ZOSYN)  IV  3.375 g Intravenous Q8H  . risperiDONE  3 mg Oral BID  . sodium chloride flush  3 mL Intravenous Q12H  . Vitamin D (Ergocalciferol)  50,000 Units Oral Q7 days  . warfarin  5 mg Oral q1800  . Warfarin - Pharmacist Dosing Inpatient   Does not apply q1800    Assessment: Pharmacy consulted to dose heparin in this 65 year old male with left ventricular thrombus. Coumadin started 9/9 per cards recs.   Goal of Therapy:  INR 2.5-3 Heparin level 0.3-0.7 units/ml Monitor platelets by anticoagulation protocol: Yes  Plan:  Coumadin 5 mg daily. Will f/u AM INR.   Luisa Hart D 02/28/2016,1:02 PM

## 2016-02-28 NOTE — Progress Notes (Addendum)
Interim note:  Patient reevaluated this afternoon, vital signs are currently stable.  Patient is currently sitting up in bed, he has eaten his entire lunch, he has been maintained from 2 L nasal cannula, he has been awake and alert without evidence of altered mental status or hemodynamic instability.  P: -Patient appears stable for transfer to the general medical floor with continued telemetry. -We'll continue the patient on IV heparin drip to be transitioned to oral Coumadin, pharmacy consulted for Coumadin management.  Warren [name], M.D. 02/28/2016

## 2016-02-28 NOTE — Progress Notes (Signed)
Pt tolerated extubated well, no stridor or shortness of breath, respiratory rate 16/min, sats 100% on roomair

## 2016-02-28 NOTE — Progress Notes (Signed)
[name] Physician-Brief Progress Note Patient Name: Warren NeighborRalph M Derk Jr. DOB: 04/01/1951 MRN: [id number]   Date of Service  02/28/2016  HPI/Events of Note  Potassium 3.1. Creatinine 1.04. Magnesium 2.3. Has peripheral IV access & enteric feeding tube.   eICU Interventions  KCl 40 mEq via tube 1      Intervention Category Intermediate Interventions: Electrolyte abnormality - evaluation and management  Lawanda CousinsJennings Billie Trager 02/28/2016, 4:52 AM

## 2016-02-28 NOTE — Progress Notes (Signed)
Attempted to contact Ethelene BrownsAnthony, the caretaker, at the [facility] where Mr. August SaucerDean lives to update them on patient's transfer.  I tried to contact the home and mobile numbers but both of their voicemail's were full.

## 2016-02-28 NOTE — Progress Notes (Signed)
Subjective: Patient extubated.  Much improved.    Objective: Current vital signs: BP 119/81   Pulse 68   Temp 97.2 F (36.2 C) (Axillary)   Resp 12   Ht 6' (1.829 m)   Wt 65.1 kg (143 lb 8.3 oz)   SpO2 100%   BMI 19.46 kg/m  Vital signs in last 24 hours: Temp:  [97.2 F (36.2 C)-98.6 F (37 C)] 97.2 F (36.2 C) (09/09 0400) Pulse Rate:  [48-68] 68 (09/09 1200) Resp:  [9-26] 12 (09/09 1200) BP: (103-145)/(53-86) 119/81 (09/09 1200) SpO2:  [100 %] 100 % (09/09 1200) FiO2 (%):  [24 %-40 %] 24 % (09/09 0830) Weight:  [65.1 kg (143 lb 8.3 oz)] 65.1 kg (143 lb 8.3 oz) (09/09 1000)  Intake/Output from previous day: 09/08 0701 - 09/09 0700 In: 2976.1 [I.V.:1263.1; NG/GT:1110; IV Piggyback:603] Out: 2085 [Urine:2085] Intake/Output this shift: Total I/O In: 18 [I.V.:18] Out: -  Nutritional status: Diet regular Room service appropriate? Yes; Fluid consistency: Thin  Neurologic Exam: Mental Status: Alert, thought content appropriate.  Speech fluent without evidence of aphasia.  Able to follow commands without difficulty. Cranial Nerves: II: Discs flat bilaterally; Visual fields grossly normal, pupils equal, round, reactive to light and accommodation III,IV, VI: ptosis not present, extra-ocular motions intact bilaterally V,VII: smile symmetric, facial light touch sensation normal bilaterally VIII: hearing normal bilaterally IX,X: gag reflex present XI: bilateral shoulder shrug XII: midline tongue extension Motor: Able to lift all extremities off the bed  Lab Results: Basic Metabolic Panel:  Recent Labs Lab 02/26/16 1032 02/26/16 2140 02/27/16 0105 02/27/16 0525 02/27/16 1253 02/27/16 1658 02/28/16 0405  NA 145  --  143  --   --   --  139  K 4.9  --  4.4  --   --   --  3.1*  CL 110  --  113*  --   --   --  105  CO2 30  --  22  --   --   --  28  GLUCOSE 89  --  102*  --   --   --  142*  BUN 33*  --  26*  --   --   --  19  CREATININE 0.98  --  0.89  --   --   --   1.04  CALCIUM 9.3  --  8.7*  --   --   --  8.3*  MG  --  1.8  --   --  1.8 1.9 2.3  PHOS  --   --   --  1.5* 2.4* 3.4 4.0    Liver Function Tests:  Recent Labs Lab 02/26/16 1032 02/28/16 0405  AST 29 23  ALT 20 16*  ALKPHOS 87 77  BILITOT 1.2 1.0  PROT 6.6 5.7*  ALBUMIN 3.6 3.0*   No results for input(s): LIPASE, AMYLASE in the last 168 hours. No results for input(s): AMMONIA in the last 168 hours.  CBC:  Recent Labs Lab 02/26/16 1032 02/27/16 0105 02/28/16 0405  WBC 9.6 10.7* 8.1  NEUTROABS 7.6* 8.3*  --   HGB 12.3* 11.5* 11.5*  HCT 35.3* 33.1* 32.9*  MCV 98.3 98.2 97.3  PLT 145* 136* 119*    Cardiac Enzymes:  Recent Labs Lab 02/27/16 0525 02/27/16 0951 02/27/16 2248 02/28/16 0405 02/28/16 0955  TROPONINI 0.03* 0.03* <0.03 <0.03 <0.03    Lipid Panel:  Recent Labs Lab 02/26/16 1032 02/27/16 0525  CHOL 133  --   TRIG 39 76  HDL 54  --   CHOLHDL 2.5  --   VLDL 8  --   LDLCALC 71  --     CBG:  Recent Labs Lab 02/27/16 1935 02/28/16 0001 02/28/16 0339 02/28/16 0712 02/28/16 1108  GLUCAP 94 118* 96 96 96    Microbiology: Results for orders placed or performed during the hospital encounter of 02/26/16  Urine culture     Status: Abnormal   Collection Time: 02/26/16 12:28 PM  Result Value Ref Range Status   Specimen Description URINE, RANDOM  Final   Special Requests NONE  Final   Culture >=100,000 COLONIES/mL VIRIDANS STREPTOCOCCUS (A)  Final   Report Status 02/27/2016 FINAL  Final  MRSA PCR Screening     Status: None   Collection Time: 02/26/16  9:12 PM  Result Value Ref Range Status   MRSA by PCR NEGATIVE NEGATIVE Final    Comment:        The GeneXpert MRSA Assay (FDA approved for NASAL specimens only), is one component of a comprehensive MRSA colonization surveillance program. It is not intended to diagnose MRSA infection nor to guide or monitor treatment for MRSA infections.     Coagulation Studies:  Recent Labs   02/27/16 1658  LABPROT 14.5  INR 1.12    Imaging: Dg Abd 1 View  Result Date: 02/26/2016 CLINICAL DATA:  Orogastric tube placement. EXAM: ABDOMEN - 1 VIEW COMPARISON:  None. FINDINGS: The orogastric tube enters the stomach and terminates in the stomach body. Gas-filled stomach noted. Leads from defibrillator pad noted in the left upper quadrant. Lumbar spondylosis and degenerative disc disease. Visualized bowel gas pattern normal. IMPRESSION: Nasogastric tube tip is in the stomach body. Electronically Signed   By: Gaylyn Rong M.D.   On: 02/26/2016 23:05   Ct Head Wo Contrast  Result Date: 02/26/2016 CLINICAL DATA:  Cardiac arrest with asystole. EXAM: CT HEAD WITHOUT CONTRAST TECHNIQUE: Contiguous axial images were obtained from the base of the skull through the vertex without intravenous contrast. COMPARISON:  02/26/2016 at 10:57 a.m. FINDINGS: Brain: Cerebral atrophy for age. Mild chronic ischemic microvascular white matter disease. Preserved gray-white junction without compelling CT findings of global anoxic event. Stable ex vacuo prominence of the ventricular system. No intracranial hemorrhage or mass lesion. Vascular: There is some atherosclerotic calcification of the dominant right vertebral artery. Skull: Unremarkable Sinuses/Orbits: There is some fluid dependently in the nasopharynx probably related to the oral intubation. Sinuses clear. Orbits unremarkable. Other: No supplemental non-categorized findings. IMPRESSION: 1. Chronically stable appearance of cerebral atrophy with ex vacuo prominence of the ventricular system. No findings of acute CVA, stigmata of recent global anoxic event, intracranial hemorrhage, or mass lesion identified. Electronically Signed   By: Gaylyn Rong M.D.   On: 02/26/2016 22:50   Mr Brain Wo Contrast  Result Date: 02/27/2016 CLINICAL DATA:  Altered mental status. Patient is a long-term group home resident. Difficulty walking. EXAM: MRI HEAD WITHOUT  CONTRAST TECHNIQUE: Multiplanar, multiecho pulse sequences of the brain and surrounding structures were obtained without intravenous contrast. COMPARISON:  CT head 02/26/2016.  MR head 01/04/2006. FINDINGS: The patient was unable to remain motionless for the exam. Small or subtle lesions could be overlooked. Brain: No acute infarction, hemorrhage, extra-axial collection or mass lesion. Global atrophy. The hydrocephalus ex vacuo. Mild white matter disease, predominantly periventricular, difficult to characterize with regard to chronic microvascular ischemic change versus transependymal absorption. Vascular: Flow voids are maintained throughout the carotid, basilar, and vertebral arteries. There are no areas of chronic  hemorrhage. Skull and upper cervical spine: Normal marrow signal. No tonsillar herniation. Sinuses/Orbits: Negative. 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 It is unclear if this was removed via some occult surgical approach or resolved spontaneously. IMPRESSION: Motion degraded exam demonstrating advanced atrophy with ventriculomegaly. No acute intracranial findings are evident. 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 Electronically Signed   By: Elsie StainJohn T Curnes M.D.   On: 02/27/2016 13:00   Dg Chest Port 1 View  Result Date: 02/27/2016 CLINICAL DATA:  Intubation. EXAM: PORTABLE CHEST 1 VIEW COMPARISON:  02/26/2016 . FINDINGS: Endotracheal tube and NG tube in stable position. Mediastinum hilar structures are normal. Heart size stable. No focal infiltrate. No pleural effusion or pneumothorax. Degenerative changes thoracic spine. The degenerative changes again noted about both shoulders. IMPRESSION: 1.  Lines and tubes in stable position. 2.  No acute cardiopulmonary disease. Electronically Signed   By: Maisie Fushomas  Register   On: 02/27/2016 07:24   Dg Chest Port 1 View  Result Date: 02/26/2016 CLINICAL DATA:  Code blue,  intubated EXAM: PORTABLE CHEST 1 VIEW COMPARISON:  02/21/2016 FINDINGS: Cardiomediastinal silhouette is stable. No acute infiltrate or pulmonary edema. Endotracheal tube in place with tip 2.5 cm above the carina. NG tube in place with tip in proximal stomach. There is no pneumothorax. Degenerative changes are noted left shoulder. IMPRESSION: No active disease. Endotracheal and NG tube in place. No pneumothorax. Electronically Signed   By: Natasha MeadLiviu  Pop M.D.   On: 02/26/2016 21:29   Dg Chest Port 1 View  Result Date: 02/26/2016 CLINICAL DATA:  Decreased activity level. Decreased communication, not walking like normal. EXAM: PORTABLE CHEST 1 VIEW COMPARISON:  Portable chest x-ray of September 16, 2015 FINDINGS: The lungs are adequately inflated and clear. The heart and pulmonary vascularity are normal. The mediastinum is normal in width. There is no pleural effusion. There is calcification in the wall of the aortic arch. There is considerable degenerative change of both shoulders with chronic inferior subluxation of the right humeral head with respect to the glenoid. IMPRESSION: There is no active cardiopulmonary disease. Aortic atherosclerosis. Chronic changes of both shoulders. Electronically Signed   By: David  SwazilandJordan M.D.   On: 02/26/2016 14:29    Medications:  I have reviewed the patient's current medications. Scheduled: . atorvastatin  40 mg Oral q1800  . benztropine  1 mg Oral QHS  . chlorhexidine  15 mL Mouth Rinse BID  . chlorproMAZINE  50 mg Oral BID  . memantine  28 mg Oral Daily   And  . donepezil  10 mg Oral Daily  . escitalopram  10 mg Oral Daily  . famotidine (PEPCID) IV  20 mg Intravenous Q12H  . fentaNYL (SUBLIMAZE) injection  50 mcg Intravenous Once  . haloperidol  5 mg Oral BID  . levothyroxine  25 mcg Oral BH-q7a  . mouth rinse  15 mL Mouth Rinse 10 times per day  . omega-3 acid ethyl esters  1 g Oral Daily  . piperacillin-tazobactam (ZOSYN)  IV  3.375 g Intravenous Q8H  .  risperiDONE  3 mg Oral BID  . sodium chloride flush  3 mL Intravenous Q12H  . Vitamin D (Ergocalciferol)  50,000 Units Oral Q7 days    Assessment/Plan: Patient much improved.  EEG shows no epileptiform activity.  MRI showed no acute changes.  Unclear what was the cause of patient's presentation but with negative work up would not be comfortable giving a diagnosis  of seizure.  Anticonvulsant therapy not indicated at this time.     LOS: 2 days   Thana Farr, MD Neurology [phone] 02/28/2016  12:49 PM

## 2016-02-29 ENCOUNTER — Inpatient Hospital Stay: Payer: Medicare Other

## 2016-02-29 LAB — PROTIME-INR
INR: 1.11
PROTHROMBIN TIME: 14.4 s (ref 11.4–15.2)

## 2016-02-29 LAB — HEPARIN LEVEL (UNFRACTIONATED)
HEPARIN UNFRACTIONATED: 0.23 [IU]/mL — AB (ref 0.30–0.70)
HEPARIN UNFRACTIONATED: 0.45 [IU]/mL (ref 0.30–0.70)
Heparin Unfractionated: 0.38 IU/mL (ref 0.30–0.70)

## 2016-02-29 LAB — GLUCOSE, CAPILLARY
GLUCOSE-CAPILLARY: 116 mg/dL — AB (ref 65–99)
GLUCOSE-CAPILLARY: 127 mg/dL — AB (ref 65–99)
Glucose-Capillary: 258 mg/dL — ABNORMAL HIGH (ref 65–99)
Glucose-Capillary: 101 mg/dL — ABNORMAL HIGH (ref 65–99)
Glucose-Capillary: 131 mg/dL — ABNORMAL HIGH (ref 65–99)
Glucose-Capillary: 364 mg/dL — ABNORMAL HIGH (ref 65–99)
Glucose-Capillary: 85 mg/dL (ref 65–99)
Glucose-Capillary: 135 mg/dL — ABNORMAL HIGH (ref 65–99)

## 2016-02-29 LAB — BASIC METABOLIC PANEL
ANION GAP: 4 — AB (ref 5–15)
BUN: 19 mg/dL (ref 6–20)
CALCIUM: 8.4 mg/dL — AB (ref 8.9–10.3)
CHLORIDE: 113 mmol/L — AB (ref 101–111)
CO2: 28 mmol/L (ref 22–32)
CREATININE: 0.99 mg/dL (ref 0.61–1.24)
GFR calc non Af Amer: >60 mL/min (ref >60)
Glucose, Bld: 107 mg/dL — ABNORMAL HIGH (ref 65–99)
Potassium: 3.8 mmol/L (ref 3.5–5.1)
SODIUM: 145 mmol/L (ref 135–145)

## 2016-02-29 LAB — CBC
HCT: 30.1 % — ABNORMAL LOW (ref 40.0–52.0)
HEMOGLOBIN: 10.6 g/dL — AB (ref 13.0–18.0)
MCH: 34.1 pg — ABNORMAL HIGH (ref 26.0–34.0)
MCHC: 35.2 g/dL (ref 32.0–36.0)
MCV: 97.1 fL (ref 80.0–100.0)
Platelets: 119 10*3/uL — ABNORMAL LOW (ref 150–440)
RBC: 3.1 MIL/uL — AB (ref 4.40–5.90)
RDW: 12.8 % (ref 11.5–14.5)
WBC: 6.6 10*3/uL (ref 3.8–10.6)

## 2016-02-29 MED ORDER — HEPARIN BOLUS VIA INFUSION
1000.0000 [IU] | Freq: Once | INTRAVENOUS | Status: AC
  Administered 2016-02-29: 1000 [IU] via INTRAVENOUS
  Filled 2016-02-29: qty 1000

## 2016-02-29 MED ORDER — INSULIN ASPART 100 UNIT/ML ~~LOC~~ SOLN
0.0000 [IU] | Freq: Three times a day (TID) | SUBCUTANEOUS | Status: DC
  Administered 2016-02-29: 1 [IU] via SUBCUTANEOUS
  Filled 2016-02-29: qty 1

## 2016-02-29 MED ORDER — DEXTROSE-NACL 5-0.45 % IV SOLN
INTRAVENOUS | Status: DC
  Administered 2016-03-01 – 2016-03-02 (×3): via INTRAVENOUS

## 2016-02-29 MED ORDER — INSULIN ASPART 100 UNIT/ML ~~LOC~~ SOLN: 0.0000 [IU] | Freq: Every day | SUBCUTANEOUS | Status: DC

## 2016-02-29 MED ORDER — INSULIN ASPART 100 UNIT/ML ~~LOC~~ SOLN
0.0000 [IU] | Freq: Four times a day (QID) | SUBCUTANEOUS | Status: DC
  Administered 2016-03-01: 1 [IU] via SUBCUTANEOUS
  Filled 2016-02-29: qty 1

## 2016-02-29 NOTE — Progress Notes (Signed)
RN made aware of cbg of 364. No SSI coverage order. MD notified of elevated cbg and order given to place SSI coverage to start at noon.

## 2016-02-29 NOTE — Progress Notes (Signed)
Rapid response, patient aspirated sandwich into airway, never pulseless, O2 sats were dropping and he was unresponsive, CPR started and sandwich dislodged then suctioned from airway.  Patient sats improved and he began responding at his baseline for this hospital stay.  CXR ordered for possible aspiration.  Kristeen MissWILLIS, Jahshua Bonito FIELDING [facility] 03/01/2016, 1:25 AM

## 2016-02-29 NOTE — Clinical Social Work Note (Signed)
CSW received consult due to patient residing in Group Home. CSW had already assessed this patient when he was in CCU. CSW already following.  Argentina PonderKaren Martha Tahesha Skeet, MSW, LCSW-A [phone]

## 2016-02-29 NOTE — Progress Notes (Signed)
[employer] [facility] [facility] [place] [facility]   PATIENT NAME: Warren [name]    MR#:  [id number]  DATE OF BIRTH:  [birth year]  SUBJECTIVE:   Patient is here due to altered mental status and noted to be in respiratory failure. Transferred out of the ICU yesterday. Extubated 2 days ago. Lethargic but follows commands. No other acute events overnight.   REVIEW OF SYSTEMS:    Review of Systems  Constitutional: Negative for chills and fever.  HENT: Negative for congestion and tinnitus.   Eyes: Negative for blurred vision and double vision.  Respiratory: Negative for cough, shortness of breath and wheezing.   Cardiovascular: Negative for chest pain, orthopnea and PND.  Gastrointestinal: Negative for abdominal pain, diarrhea, nausea and vomiting.  Genitourinary: Negative for dysuria and hematuria.  Neurological: Negative for dizziness, sensory change and focal weakness.  All other systems reviewed and are negative.   Nutrition: REgular Tolerating Diet: Yes Tolerating PT: Await Eval   DRUG ALLERGIES:  No Known Allergies  VITALS:  Blood pressure (!) 104/44, pulse 70, temperature 98.2 F (36.8 C), resp. rate 16, height 6' (1.829 m), weight 69.8 kg (153 lb 12.8 oz), SpO2 97 %.  PHYSICAL EXAMINATION:   Physical Exam  GENERAL:  65 y.o.-year-old patient lying in the bed lethargic but follows simple commands.  EYES: Pupils equal, round, reactive to light and accommodation. No scleral icterus. Extraocular muscles intact.  HEENT: Head atraumatic, normocephalic. Oropharynx and nasopharynx clear.  NECK:  Supple, no jugular venous distention. No thyroid enlargement, no tenderness.  LUNGS: Normal breath sounds bilaterally, no wheezing, rales, rhonchi. No use of accessory muscles of respiration.  CARDIOVASCULAR: S1, S2 normal. No murmurs, rubs, or gallops.  ABDOMEN: Soft, nontender, nondistended. Bowel sounds present. No organomegaly or mass.  EXTREMITIES: No cyanosis, clubbing or edema  b/l.    NEUROLOGIC: Cranial nerves II through XII are intact. No focal Motor or sensory deficits b/l. Globally weak.   PSYCHIATRIC: The patient is alert and oriented x 2.  SKIN: No obvious rash, lesion, or ulcer.    LABORATORY PANEL:   CBC  Recent Labs Lab 02/29/16 0418  WBC 6.6  HGB 10.6*  HCT 30.1*  PLT 119*   ------------------------------------------------------------------------------------------------------------------  Chemistries   Recent Labs Lab 02/28/16 0405 02/28/16 1702 02/29/16 0418  NA 139  --  145  K 3.1*  --  3.8  CL 105  --  113*  CO2 28  --  28  GLUCOSE 142*  --  107*  BUN 19  --  19  CREATININE 1.04  --  0.99  CALCIUM 8.3*  --  8.4*  MG 2.3 2.3  --   AST 23  --   --   ALT 16*  --   --   ALKPHOS 77  --   --   BILITOT 1.0  --   --    ------------------------------------------------------------------------------------------------------------------  Cardiac Enzymes  Recent Labs Lab 02/28/16 0955  TROPONINI <0.03   ------------------------------------------------------------------------------------------------------------------  RADIOLOGY:  No results found.   ASSESSMENT AND PLAN:   65 year old male with past medical history of dementia, bipolar disorder, depression, GERD/anxiety who presented to the hospital due to altered mental status.  1. AMS - etiology unclear presently.  Much improved now and back to baseline.  - seen by neuro and they are trying to r/o seizures.  - EEG pending. Appreciate Neurlogy input.  -Other neurologic workup has been negative. MRI negative.  2. Status post cardiac arrest-etiology also unclear. -Cardiology following the patient  and patient noted to have a mural thrombus on echocardiogram currently on heparin drip. Will start Coumadin to maintain INR 2-3.   3. Mural Thrombus - cont. Heparin, coumadin and follow to INR 2-3.   4. DM - cont. SSI and follow BS  5. Dementia - cont. Aricept/Namenda  6.  Bipolar Disorder - cont. Risperdal  7. Acute REsp. Failure w/ Hypoxia - pt. Was intubated due to cardiac arrest.  - ?? Aspiration pneumonia. Cont. Zosyn and will switch to Augment in next 1-2 days.   All the records are reviewed and case discussed with Care Management/Social Worker. Management plans discussed with the patient, family and they are in agreement.  CODE STATUS: Full  DVT Prophylaxis: Heparin and Coumadin  TOTAL TIME TAKING CARE OF THIS PATIENT: 35 minutes.   POSSIBLE D/C IN 2-3 DAYS, DEPENDING ON CLINICAL CONDITION.   Kastiel Simonian J M.D on 02/29/2016 at 1:33 PM  Between 7am to [place] Siren6pm - Pager - [phone]  After 6pm go to [email or web address] - Social research officer, governmentpassword EPAS [facility]  [facility] [place] [facility]  Office  [phone]  CC: Primary care physician; Evelene CroonNIEMEYER, MEINDERT, MD

## 2016-02-29 NOTE — Progress Notes (Signed)
ANTICOAGULATION CONSULT NOTE - Initial Consult  Pharmacy Consult for Heparin/Warfarin Indication: Left ventricular thrombus  No Known Allergies  Patient Measurements: Height: 6' (182.9 cm) Weight: 153 lb 12.8 oz (69.8 kg) IBW/kg (Calculated) : 77.6 Heparin Dosing Weight: 65.5 kg   Vital Signs: Temp: 98.2 F (36.8 C) (09/10 1132) BP: 104/44 (09/10 1132) Pulse Rate: 70 (09/10 1132)  Labs:  Recent Labs  02/27/16 0105  02/27/16 1658 02/27/16 2248 02/28/16 0405  02/28/16 0955  02/28/16 2106 02/29/16 0418 02/29/16 1049  HGB 11.5*  --   --   --  11.5*  --   --   --   --  10.6*  --   HCT 33.1*  --   --   --  32.9*  --   --   --   --  30.1*  --   PLT 136*  --   --   --  119*  --   --   --   --  119*  --   APTT  --   --  40*  --   --   --   --   --   --   --   --   LABPROT  --   --  14.5  --   --   --   --   --   --  14.4  --   INR  --   --  1.12  --   --   --   --   --   --  1.11  --   HEPARINUNFRC  --   --   --  1.02*  --   < >  --   < > 0.40 0.23* 0.45  CREATININE 0.89  --   --   --  1.04  --   --   --   --  0.99  --   TROPONINI <0.03  < >  --  <0.03 <0.03  --  <0.03  --   --   --   --   < > = values in this interval not displayed.  Estimated Creatinine Clearance: 73.4 mL/min (by C-G formula based on SCr of 0.99 mg/dL).   Assessment: Pharmacy consulted to dose heparin/warfarin in this 65 year old male with left ventricular thrombus.   Goal of Therapy:  INR 2.5-3 Heparin level 0.3-0.7 units/ml Monitor platelets by anticoagulation protocol: Yes   Plan:  Current orders for heparin 850 units/hr. Heparin level therapeutic x1. Will recheck in 6 hours.   Patient to begin warfarin last night 9/9, ordered 5mg , however dose not given. Will initiate today at 5mg . F/U INR with AM labs  Ebin Palazzi C 02/29/2016,11:40 AM

## 2016-02-29 NOTE — Progress Notes (Signed)
SUBJECTIVE: Patient is more alert today denies any chest pain or shortness of breath   Vitals:   02/28/16 1943 02/29/16 0426 02/29/16 0430 02/29/16 1132  BP: (!) 143/52  (!) 123/54 (!) 104/44  Pulse: 66  64 70  Resp: 18  18 16   Temp: 97.8 F (36.6 C)  97.9 F (36.6 C) 98.2 F (36.8 C)  TempSrc: Oral     SpO2: 100%  97% 97%  Weight: 153 lb 12.8 oz (69.8 kg) 153 lb 12.8 oz (69.8 kg)    Height:        Intake/Output Summary (Last 24 hours) at 02/29/16 1151 Last data filed at 02/29/16 0900  Gross per 24 hour  Intake           823.33 ml  Output             1525 ml  Net          -701.67 ml    LABS: Basic Metabolic Panel:  Recent Labs  11/91/4708/03/07 0405 02/28/16 1702 02/29/16 0418  NA 139  --  145  K 3.1*  --  3.8  CL 105  --  113*  CO2 28  --  28  GLUCOSE 142*  --  107*  BUN 19  --  19  CREATININE 1.04  --  0.99  CALCIUM 8.3*  --  8.4*  MG 2.3 2.3  --   PHOS 4.0 3.4  --    Liver Function Tests:  Recent Labs  02/28/16 0405  AST 23  ALT 16*  ALKPHOS 77  BILITOT 1.0  PROT 5.7*  ALBUMIN 3.0*   No results for input(s): LIPASE, AMYLASE in the last 72 hours. CBC:  Recent Labs  02/27/16 0105 02/28/16 0405 02/29/16 0418  WBC 10.7* 8.1 6.6  NEUTROABS 8.3*  --   --   HGB 11.5* 11.5* 10.6*  HCT 33.1* 32.9* 30.1*  MCV 98.2 97.3 97.1  PLT 136* 119* 119*   Cardiac Enzymes:  Recent Labs  02/27/16 2248 02/28/16 0405 02/28/16 0955  TROPONINI <0.03 <0.03 <0.03   BNP: Invalid input(s): POCBNP D-Dimer: No results for input(s): DDIMER in the last 72 hours. Hemoglobin A1C: No results for input(s): HGBA1C in the last 72 hours. Fasting Lipid Panel:  Recent Labs  02/27/16 0525  TRIG 76   Thyroid Function Tests: No results for input(s): TSH, T4TOTAL, T3FREE, THYROIDAB in the last 72 hours.  Invalid input(s): FREET3 Anemia Panel: No results for input(s): VITAMINB12, FOLATE, FERRITIN, TIBC, IRON, RETICCTPCT in the last 72 hours.   PHYSICAL EXAM General:  Well developed, well nourished, in no acute distress HEENT:  Normocephalic and atramatic Neck:  No JVD.  Lungs: Clear bilaterally to auscultation and percussion. Heart: HRRR . Normal S1 and S2 without gallops or murmurs.  Abdomen: Bowel sounds are positive, abdomen soft and non-tender  Msk:  Back normal, normal gait. Normal strength and tone for age. Extremities: No clubbing, cyanosis or edema.   Neuro: Alert and oriented X 3. Psych:  Good affect, responds appropriately  TELEMETRY:Sinus rhythm with sinus bradycardia  ASSESSMENT AND PLAN: Atypical left ventricular thrombus in a setting of CVA we will put in consult for Coumadin with pharmacy and keep INR between 2 and 3.  Principal Problem:   Altered mental status Active Problems:   Respiratory failure (HCC)    Yeshaya Vath A, MD, [facility] CenterFACC 02/29/2016 11:51 AM

## 2016-02-29 NOTE — Progress Notes (Signed)
The Patient was talking to the nurse while he was eating his sandwich and the RN was hanging famotidine IV.  Patient was not responding to the nurse's conversation. The RN looked at the patient and realized the patient was rolling his eyes and could not talk. Rapid respond called because the patient had a pulse. The rapid was cancelled and code blue called as the patient became unresponsive with thready pulse. CPR was performed while we realized the patient was choking.  Patient started vomiting the choking materials (bread) and later became responsive and talkative. Patient back to his baseline after the choking material came off.  The attending physician (Dr. Anne HahnWillis) ordered Chest X-Ray, speech pathology evauation and D50 to be run at 75 mL/hr as patient is kept NPO.  No code medication was administered. Will continue to monitor.

## 2016-02-29 NOTE — Progress Notes (Signed)
RN rechecked cbg to ensure elevated reading was accurate since pt has no hx of DM and no medications to raise cbg ordered. Recheck of cbg was 131, 1 unit of insulin still administered. Nursing will continue to assess.

## 2016-02-29 NOTE — Progress Notes (Signed)
ANTICOAGULATION CONSULT NOTE - Initial Consult  Pharmacy Consult for Heparin/Warfarin Indication: Left ventricular thrombus  No Known Allergies  Patient Measurements: Height: 6' (182.9 cm) Weight: 153 lb 12.8 oz (69.8 kg) IBW/kg (Calculated) : 77.6 Heparin Dosing Weight: 65.5 kg   Vital Signs: Temp: 98.2 F (36.8 C) (09/10 1132) BP: 117/48 (09/10 1740) Pulse Rate: 71 (09/10 1740)  Labs:  Recent Labs  02/27/16 0105  02/27/16 1658 02/27/16 2248 02/28/16 0405  02/28/16 0955  02/29/16 0418 02/29/16 1049 02/29/16 1756  HGB 11.5*  --   --   --  11.5*  --   --   --  10.6*  --   --   HCT 33.1*  --   --   --  32.9*  --   --   --  30.1*  --   --   PLT 136*  --   --   --  119*  --   --   --  119*  --   --   APTT  --   --  40*  --   --   --   --   --   --   --   --   LABPROT  --   --  14.5  --   --   --   --   --  14.4  --   --   INR  --   --  1.12  --   --   --   --   --  1.11  --   --   HEPARINUNFRC  --   --   --  1.02*  --   < >  --   < > 0.23* 0.45 0.38  CREATININE 0.89  --   --   --  1.04  --   --   --  0.99  --   --   TROPONINI <0.03  < >  --  <0.03 <0.03  --  <0.03  --   --   --   --   < > = values in this interval not displayed.  Estimated Creatinine Clearance: 73.4 mL/min (by C-G formula based on SCr of 0.99 mg/dL).   Assessment: Pharmacy consulted to dose heparin/warfarin in this 65 year old male with left ventricular thrombus.   Goal of Therapy:  INR 2.5-3 Heparin level 0.3-0.7 units/ml Monitor platelets by anticoagulation protocol: Yes   Plan:  Current orders for heparin 850 units/hr. Heparin level therapeutic x2. Will recheck in AM.   Patient to begin warfarin last night 9/9, ordered 5mg , however dose not given. Will initiate today at 5mg . F/U INR with AM labs  Marrisa Kimber D Javad Salva 02/29/2016,6:40 PM

## 2016-03-01 LAB — CBC
HCT: 31.1 % — ABNORMAL LOW (ref 40.0–52.0)
Hemoglobin: 10.6 g/dL — ABNORMAL LOW (ref 13.0–18.0)
MCH: 33.7 pg (ref 26.0–34.0)
MCHC: 34 g/dL (ref 32.0–36.0)
MCV: 99.3 fL (ref 80.0–100.0)
PLATELETS: 136 10*3/uL — AB (ref 150–440)
RBC: 3.13 MIL/uL — AB (ref 4.40–5.90)
RDW: 13.3 % (ref 11.5–14.5)
WBC: 8.1 10*3/uL (ref 3.8–10.6)

## 2016-03-01 LAB — GLUCOSE, CAPILLARY
GLUCOSE-CAPILLARY: 114 mg/dL — AB (ref 65–99)
GLUCOSE-CAPILLARY: 130 mg/dL — AB (ref 65–99)
GLUCOSE-CAPILLARY: 160 mg/dL — AB (ref 65–99)
GLUCOSE-CAPILLARY: 98 mg/dL (ref 65–99)
GLUCOSE-CAPILLARY: 99 mg/dL (ref 65–99)
Glucose-Capillary: 103 mg/dL — ABNORMAL HIGH (ref 65–99)

## 2016-03-01 LAB — TRIGLYCERIDES: TRIGLYCERIDES: 33 mg/dL (ref <150)

## 2016-03-01 LAB — PROTIME-INR
INR: 1.12
Prothrombin Time: 14.5 seconds (ref 11.4–15.2)

## 2016-03-01 LAB — HEPARIN LEVEL (UNFRACTIONATED): HEPARIN UNFRACTIONATED: 0.37 [IU]/mL (ref 0.30–0.70)

## 2016-03-01 MED ORDER — FAMOTIDINE 20 MG PO TABS
20.0000 mg | ORAL_TABLET | Freq: Two times a day (BID) | ORAL | Status: DC
  Administered 2016-03-01 – 2016-03-04 (×6): 20 mg via ORAL
  Filled 2016-03-01 (×6): qty 1

## 2016-03-01 MED ORDER — WARFARIN SODIUM 5 MG PO TABS
7.5000 mg | ORAL_TABLET | Freq: Once | ORAL | Status: AC
  Administered 2016-03-01: 7.5 mg via ORAL
  Filled 2016-03-01: qty 2

## 2016-03-01 NOTE — Progress Notes (Addendum)
ANTICOAGULATION CONSULT NOTE - Initial Consult  Pharmacy Consult for Heparin/Warfarin Indication: Left ventricular thrombus  No Known Allergies  Patient Measurements: Height: 6' (182.9 cm) Weight: 148 lb 11.2 oz (67.4 kg) IBW/kg (Calculated) : 77.6 Heparin Dosing Weight: 65.5 kg   Vital Signs: Temp: 97.5 F (36.4 C) (09/11 0506) Temp Source: Oral (09/11 0506) BP: 140/66 (09/11 0506) Pulse Rate: 57 (09/11 0506)  Labs:  Recent Labs  02/27/16 1658 02/27/16 2248  02/28/16 0405  02/28/16 0955  02/29/16 0418 02/29/16 1049 02/29/16 1756 03/01/16 0228  HGB  --   --   < > 11.5*  --   --   --  10.6*  --   --  10.6*  HCT  --   --   --  32.9*  --   --   --  30.1*  --   --  31.1*  PLT  --   --   --  119*  --   --   --  119*  --   --  136*  APTT 40*  --   --   --   --   --   --   --   --   --   --   LABPROT 14.5  --   --   --   --   --   --  14.4  --   --  14.5  INR 1.12  --   --   --   --   --   --  1.11  --   --  1.12  HEPARINUNFRC  --  1.02*  --   --   < >  --   < > 0.23* 0.45 0.38 0.37  CREATININE  --   --   --  1.04  --   --   --  0.99  --   --   --   TROPONINI  --  <0.03  --  <0.03  --  <0.03  --   --   --   --   --   < > = values in this interval not displayed.  Estimated Creatinine Clearance: 71 mL/min (by C-G formula based on SCr of 0.99 mg/dL).   Assessment: Pharmacy consulted to dose heparin/warfarin in this 65 year old male with left ventricular thrombus.   Goal of Therapy:  INR 2.5-3 Heparin level 0.3-0.7 units/ml Monitor platelets by anticoagulation protocol: Yes   Plan:   Heparin: Current orders for heparin 850 units/hr. Heparin level therapeutic x2. Will recheck in AM.   9/11 AM heparin level 0.37. Continue current regimen. Recheck heparin level and CBC tomorrow AM.  Warfarin: Patient to begin warfarin last night 9/9, ordered 5mg , however dose not given. Will initiate today at 5mg . F/U INR with AM labs 9/11 AM INR 1.12. Continue warfarin 5 mg daily.    Jakyrah Holladay S 03/01/2016,5:31 AM

## 2016-03-01 NOTE — Progress Notes (Signed)
PHARMACIST - PHYSICIAN COMMUNICATION  CONCERNING: IV to Oral Route Change Policy  RECOMMENDATION: This patient is receiving FAMOTIDINE by the intravenous route.  Based on criteria approved by the Pharmacy and Therapeutics Committee, the intravenous medication(s) is/are being converted to the equivalent oral dose form(s).   DESCRIPTION: These criteria include:  The patient is eating (either orally or via tube) and/or has been taking other orally administered medications for [name] least 24 hours  The patient has no evidence of active gastrointestinal bleeding or impaired GI absorption (gastrectomy, short bowel, patient on TNA or NPO).  If you have questions about this conversion, please contact the Pharmacy Department  []   [phone]( [phone] )  [facility] [x]   [phone]( [phone] )  [facility] []   [phone]( [phone] )  [facility] []   [phone]( [phone] )  [facility] []   [phone]( [phone] )  [facility]   [place]comerstown[place]err[place],Warren [name], [facility] HospitalRPH 03/01/2016 4:34 PM

## 2016-03-01 NOTE — Progress Notes (Signed)
PT Cancellation Note  Patient Details Name: Warren NeighborRalph M Mcnulty Jr. MRN: [id number] DOB: 06/14/1951   Cancelled Treatment:    Reason Eval/Treat Not Completed: Other (comment).  PT consult received.  Chart reviewed.  Pt currently being treated for left ventricular thrombus (per chart pt most likely with cardio-embolic stroke) and INR not yet therapeutic.  Will hold PT at this time and re-attempt PT eval at a later date/time as medically appropriate.   [name] 03/01/2016, 10:33 AM Hendricks LimesEmily Gable [name], PT [phone]

## 2016-03-01 NOTE — Evaluation (Signed)
Clinical/Bedside Swallow Evaluation Patient Details  Name: Warren [name]. MRN: [id number] Date of Birth: March 16, 1951  Today's Date: 03/01/2016 Time: SLP Start Time (ACUTE ONLY): 1210 SLP Stop Time (ACUTE ONLY): 1310 SLP Time Calculation (min) (ACUTE ONLY): 60 min  Past Medical History:  Past Medical History:  Diagnosis Date  . Anxiety   . Bipolar 1 disorder (HCC)   . Bone disorder   . Depression   . GERD (gastroesophageal reflux disease)    Past Surgical History: History reviewed. No pertinent surgical history. HPI:  Warren [name]  is a 65 y.o. male with a known history of Anxiety, bipolar disorder, depression, gastric surgery for disease- lives in a group home for last 17 years without any visits from family. The owner of the group home is his primary caretaker and decision maker. He was sent to emergency room by his caretaker because he is not himself today. Normally he is able to walk without any support he is very talkative, today he was noted confused and not talking and just keep staring. He also only walk one to 2 steps this morning and remains seated in his chair which is very unusual for him. The Patient was talking to the nurse while he was eating his sandwich and the RN was hanging famotidine IV.  Patient was not responding to the nurse's conversation. The RN looked at the patient and realized the patient was rolling his eyes and could not talk. Rapid respond called because the patient had a pulse. The rapid was cancelled and code blue called as the patient became unresponsive with thready pulse. CPR was performed while we realized the patient was choking.  Patient started vomiting the choking materials (bread) and later became responsive and talkative. Patient back to his baseline after the choking material came off. Skilled ST ordered for possibility of return to safe PO intake.    Assessment / Plan / Recommendation Clinical Impression  Pt appears at reduced aspiration risks following  general aspiration precaution on dysphagia 3 diet with thin liquids, medicine whole in puree. Pt consumed trials of dysphagia 3 without minimal residue but exhibited a munch chew and decreased lingual manipulation of bolus. Pt unable to follow compensatory swallow strategies d/t decreased baseline congitive deificts and impulsivity. Pt needs phsyical assistance to removed straw or decrease food bolus. Pt given trials of graham crackers with peanut butter but demonstrated mild increased oral residue. Pt consumed thin liquids via spoon, cup and straw without overt s/s of aspiration. Again, pt with difficulty following strategy to take single sips. Given this and his inability to follow compensatory strategies, ST recommends that he return to dysphagia 3 diet without bread, thin liquids and full nursing supervision for all PO intake. Education provided to pt and nursing as well as posted in pt's room. All voiced agreement and understanding.     Aspiration Risk  Mild aspiration risk    Diet Recommendation Dysphagia 3, no bread, thin liquids via straw  Medication Administration: Whole meds with puree    Other  Recommendations Oral Care Recommendations: Oral care BID   Follow up Recommendations  None     Swallow Study   General Date of Onset: 02/29/16 HPI: Warren [name]  is a 64 y.o. male with a known history of Anxiety, bipolar disorder, depression, gastric surgery for disease- lives in a group home for last 17 years without any visits from family. The owner of the group home is his primary caretaker and decision maker. He was sent  to emergency room by his caretaker because he is not himself today. Normally he is able to walk without any support he is very talkative, today he was noted confused and not talking and just keep staring. He also only walk one to 2 steps this morning and remains seated in his chair which is very unusual for him. The Patient was talking to the nurse while he was eating his sandwich  and the RN was hanging famotidine IV.  Patient was not responding to the nurse's conversation. The RN looked at the patient and realized the patient was rolling his eyes and could not talk. Rapid respond called because the patient had a pulse. The rapid was cancelled and code blue called as the patient became unresponsive with thready pulse. CPR was performed while we realized the patient was choking.  Patient started vomiting the choking materials (bread) and later became responsive and talkative. Patient back to his baseline after the choking material came off. Skilled ST ordered for possibility of return to safe PO intake.  Type of Study: Bedside Swallow Evaluation Previous Swallow Assessment: N/A Diet Prior to this Study: Regular;Thin liquids Temperature Spikes Noted: No Respiratory Status: Room air History of Recent Intubation: No Behavior/Cognition: Alert;Cooperative;Pleasant mood;Confused;Impulsive;Requires cueing;Doesn't follow directions (At cognitive baseline) Oral Cavity Assessment: Within Functional Limits Oral Care Completed by SLP: No Oral Cavity - Dentition: Edentulous Vision: Functional for self-feeding Self-Feeding Abilities: Able to feed self;Needs assist;Needs set up (Needs full supervision d/t cognition/impulsivity) Patient Positioning: Upright in bed Baseline Vocal Quality: Normal Volitional Cough: Strong Volitional Swallow: Able to elicit    Oral/Motor/Sensory Function Overall Oral Motor/Sensory Function: Within functional limits   Ice Chips Ice chips: Within functional limits Presentation: Spoon (ST fed 6 trials)   Thin Liquid Thin Liquid: Within functional limits Presentation: Cup;Self Fed;Spoon;Straw (ST had to physically remove straw d/t impulsivity)    Nectar Thick Nectar Thick Liquid: Not tested   Honey Thick Honey Thick Liquid: Not tested   Puree Puree: Within functional limits Presentation: Self Fed;Spoon (ST had to physically remove d/t impulsive rate)    Solid   GO   Solid: Impaired Presentation: Self Fed;Spoon (WNL for moistened graham crackers, mild residue with dry ) Oral Phase Impairments: Reduced lingual movement/coordination;Poor awareness of bolus;Impaired mastication (Munch chew) Oral Phase Functional Implications: Impaired mastication;Oral residue (Increased residue with peanut butter graham crackers)        Jadaya Sommerfield 03/01/2016,1:15 PM

## 2016-03-01 NOTE — Progress Notes (Signed)
SUBJECTIVE: Patient denies any chest pain or shortness of breath is much more alert   Vitals:   02/29/16 2236 02/29/16 2242 03/01/16 0058 03/01/16 0506  BP: (!) 193/76 (!) 144/62 (!) 105/47 140/66  Pulse: 78 71 68 (!) 57  Resp:   16 18  Temp:   98.3 F (36.8 C) 97.5 F (36.4 C)  TempSrc:    Oral  SpO2: 97% 98% 100% 100%  Weight:    148 lb 11.2 oz (67.4 kg)  Height:        Intake/Output Summary (Last 24 hours) at 03/01/16 0840 Last data filed at 03/01/16 0800  Gross per 24 hour  Intake          1346.33 ml  Output              900 ml  Net           446.33 ml    LABS: Basic Metabolic Panel:  Recent Labs  08/65/7808/03/07 0405 02/28/16 1702 02/29/16 0418  NA 139  --  145  K 3.1*  --  3.8  CL 105  --  113*  CO2 28  --  28  GLUCOSE 142*  --  107*  BUN 19  --  19  CREATININE 1.04  --  0.99  CALCIUM 8.3*  --  8.4*  MG 2.3 2.3  --   PHOS 4.0 3.4  --    Liver Function Tests:  Recent Labs  02/28/16 0405  AST 23  ALT 16*  ALKPHOS 77  BILITOT 1.0  PROT 5.7*  ALBUMIN 3.0*   No results for input(s): LIPASE, AMYLASE in the last 72 hours. CBC:  Recent Labs  02/29/16 0418 03/01/16 0228  WBC 6.6 8.1  HGB 10.6* 10.6*  HCT 30.1* 31.1*  MCV 97.1 99.3  PLT 119* 136*   Cardiac Enzymes:  Recent Labs  02/27/16 2248 02/28/16 0405 02/28/16 0955  TROPONINI <0.03 <0.03 <0.03   BNP: Invalid input(s): POCBNP D-Dimer: No results for input(s): DDIMER in the last 72 hours. Hemoglobin A1C: No results for input(s): HGBA1C in the last 72 hours. Fasting Lipid Panel:  Recent Labs  03/01/16 0228  TRIG 33   Thyroid Function Tests: No results for input(s): TSH, T4TOTAL, T3FREE, THYROIDAB in the last 72 hours.  Invalid input(s): FREET3 Anemia Panel: No results for input(s): VITAMINB12, FOLATE, FERRITIN, TIBC, IRON, RETICCTPCT in the last 72 hours.   PHYSICAL EXAM General: Well developed, well nourished, in no acute distress HEENT:  Normocephalic and atramatic Neck:   No JVD.  Lungs: Clear bilaterally to auscultation and percussion. Heart: HRRR . Normal S1 and S2 without gallops or murmurs.  Abdomen: Bowel sounds are positive, abdomen soft and non-tender  Msk:  Back normal, normal gait. Normal strength and tone for age. Extremities: No clubbing, cyanosis or edema.   Neuro: Alert and oriented X 3. Psych:  Good affect, responds appropriately  TELEMETRY:Sinus rhythm  ASSESSMENT AND PLAN: Sinus bradycardia/CVA/status post respiratory failure/left ventricular apical thrombus. Anticoagulation with Coumadin has been started by pharmacy consult. Patient can be followed as outpatient in the office for INR and cardiac follow-up.  Principal Problem:   Altered mental status Active Problems:   Respiratory failure (HCC)    Peggi Yono A, MD, [facility] CountyFACC 03/01/2016 8:40 AM

## 2016-03-01 NOTE — Plan of Care (Signed)
Problem: Physical Regulation: Goal: Will remain free from infection Outcome: Progressing IV antibiotics  Problem: Tissue Perfusion: Goal: Risk factors for ineffective tissue perfusion will decrease Outcome: Progressing Heparin gtt  Problem: Fluid Volume: Goal: Ability to maintain a balanced intake and output will improve Outcome: Progressing Foley in place  Problem: Nutrition: Goal: Adequate nutrition will be maintained Outcome: Not Progressing NPO for swallow eval

## 2016-03-01 NOTE — Progress Notes (Signed)
Nutrition Follow-up  DOCUMENTATION CODES:   Not applicable  INTERVENTION:  -Diet advancement per MD -Provide Ensure Enlive po BID, each supplement provides 350 kcal and 20 grams of protein w/ diet advancement  NUTRITION DIAGNOSIS:   Inadequate oral intake related to acute illness as evidenced by NPO status. -Ongoing  GOAL:   Patient will meet greater than or equal to 90% of their needs -Not meeting (currently NPO)  MONITOR:   PO intake, Diet advancement, Labs, I & O's, Skin, Supplement acceptance  REASON FOR ASSESSMENT:   Ventilator, Consult Enteral/tube feeding initiation and management  ASSESSMENT:   65 yo male admitted with AMS. Pt beceme unresponsive with agonal respirations on 9/7, code blue called and pt required intubation  Rayna SextonRalph was eating a sandwich yesterday when he aspirated, code blue was called, CPR began, pt vomited choking materials. He is aware today for the most part, responds appropriately to questions. He states he is hungry, was demanding apple juice during my visit. Continues to be NPO --> awaiting SLP eval from aspiration yesterday. Denies nausea/vomiting today. Labs and medications reviewed: CBG 160 Omega 3s, Vitamin D, Coumadin D5 @ 6550mL/hr --> 204 calories D5 + NS @ 2875mL/hr --> 306 calories  Diet Order:  Diet NPO time specified  Skin:  Reviewed, no issues  Last BM:  no documented BM  Height:   Ht Readings from Last 1 Encounters:  02/26/16 6' (1.829 m)    Weight:   Wt Readings from Last 1 Encounters:  03/01/16 148 lb 11.2 oz (67.4 kg)    Ideal Body Weight:     BMI:  Body mass index is 20.17 kg/m.  Estimated Nutritional Needs:   Kcal:  1594 kcals  Protein:  99-132 g  Fluid:  >/= 1.6 L  EDUCATION NEEDS:   No education needs identified at this time  Dionne AnoWilliam M. Brynnan Rodenbaugh, MS, RD LDN Inpatient Clinical Dietitian Pager [phone]

## 2016-03-01 NOTE — Care Management (Signed)
Patient moved out of icu to 2A 9.9.  A rapid response was called 9.10 evening for dropping 02 sats, and unresponsiveness.  Patient apparently choked on his sandwich.  His mental status is at his baseline per report.  SLP consult  in progress.  On heparin drip for mural thrombus and has been started on coumadin.  PT consult is pending medical stability

## 2016-03-01 NOTE — Progress Notes (Signed)
ANTICOAGULATION CONSULT NOTE -Follow up Consult  Pharmacy Consult for Heparin/Warfarin Indication: Left ventricular thrombus  No Known Allergies  Patient Measurements: Height: 6' (182.9 cm) Weight: 148 lb 11.2 oz (67.4 kg) IBW/kg (Calculated) : 77.6 Heparin Dosing Weight: 65.5 kg   Vital Signs: Temp: 97.5 F (36.4 C) (09/11 0506) Temp Source: Oral (09/11 0506) BP: 140/66 (09/11 0506) Pulse Rate: 57 (09/11 0506)  Labs:  Recent Labs  02/27/16 1658 02/27/16 2248  02/28/16 0405  02/28/16 0955  02/29/16 0418 02/29/16 1049 02/29/16 1756 03/01/16 0228  HGB  --   --   < > 11.5*  --   --   --  10.6*  --   --  10.6*  HCT  --   --   --  32.9*  --   --   --  30.1*  --   --  31.1*  PLT  --   --   --  119*  --   --   --  119*  --   --  136*  APTT 40*  --   --   --   --   --   --   --   --   --   --   LABPROT 14.5  --   --   --   --   --   --  14.4  --   --  14.5  INR 1.12  --   --   --   --   --   --  1.11  --   --  1.12  HEPARINUNFRC  --  1.02*  --   --   < >  --   < > 0.23* 0.45 0.38 0.37  CREATININE  --   --   --  1.04  --   --   --  0.99  --   --   --   TROPONINI  --  <0.03  --  <0.03  --  <0.03  --   --   --   --   --   < > = values in this interval not displayed.  Estimated Creatinine Clearance: 71 mL/min (by C-G formula based on SCr of 0.99 mg/dL).   Assessment: Pharmacy consulted to dose heparin/warfarin in this 65 year old male with left ventricular thrombus. Patient on no anticoagulant medication at group home.  9/8   INR: 1.12 9/9   INR:  No INR   No Warfarin given 9/10 INR: 1.10     Warfarin 5 mg 9/11 INR: 1.12   Goal of Therapy:  INR 2.5-3 Heparin level 0.3-0.7 units/ml Monitor platelets by anticoagulation protocol: Yes   Plan:   -Heparin: Current orders for heparin 850 units/hr. Heparin level therapeutic x2. Will recheck in AM.   9/11 AM heparin level 0.37. Continue current regimen. Recheck heparin level and CBC tomorrow AM.   -Warfarin: Patient to  begin warfarin 9/9, ordered 5mg , however dose not given. Will initiate today at 5mg . F/U INR with AM labs  9/11 AM INR 1.12. Will increase Warfarin to 7.5 mg x 1 dose. F/u INR in am.   Alaiyah Bollman A 03/01/2016,8:29 AM

## 2016-03-01 NOTE — Progress Notes (Signed)
[employer] - [place] at [facility]   PATIENT NAME: Warren [name]    MR#:  [id number]  DATE OF BIRTH:  Dec 22, 1950  SUBJECTIVE:   Patient is here due to altered mental status and noted to be in respiratory failure. Transferred out of the ICU 2 days ago. Extubated 2 days ago. more awake today and following commands. Patient choked on a sandwich yesterday but did not lose pulse. He was hypoxic and had his airway suctioned. Stable soft and they're in. Seen by speech and started on a dysphagia 3 diet.  REVIEW OF SYSTEMS:    Review of Systems  Constitutional: Negative for chills and fever.  HENT: Negative for congestion and tinnitus.   Eyes: Negative for blurred vision and double vision.  Respiratory: Negative for cough, shortness of breath and wheezing.   Cardiovascular: Negative for chest pain, orthopnea and PND.  Gastrointestinal: Negative for abdominal pain, diarrhea, nausea and vomiting.  Genitourinary: Negative for dysuria and hematuria.  Neurological: Negative for dizziness, sensory change and focal weakness.  All other systems reviewed and are negative.   Nutrition: Dysphagia III with thin liquids Tolerating Diet: Yes Tolerating PT: Await Eval   DRUG ALLERGIES:  No Known Allergies  VITALS:  Blood pressure (!) 145/61, pulse (!) 59, temperature 98 F (36.7 C), resp. rate 16, height 6' (1.829 m), weight 67.4 kg (148 lb 11.2 oz), SpO2 100 %.  PHYSICAL EXAMINATION:   Physical Exam  GENERAL:  65 y.o.-year-old patient lying in the bed in NAD.  EYES: Pupils equal, round, reactive to light and accommodation. No scleral icterus. Extraocular muscles intact.  HEENT: Head atraumatic, normocephalic. Oropharynx and nasopharynx clear.  NECK:  Supple, no jugular venous distention. No thyroid enlargement, no tenderness.  LUNGS: Normal breath sounds bilaterally, no wheezing, rales, rhonchi. No use of accessory muscles of respiration.  CARDIOVASCULAR: S1, S2 normal. No  murmurs, rubs, or gallops.  ABDOMEN: Soft, nontender, nondistended. Bowel sounds present. No organomegaly or mass.  EXTREMITIES: No cyanosis, clubbing or edema b/l.    NEUROLOGIC: Cranial nerves II through XII are intact. No focal Motor or sensory deficits b/l. Globally weak.   PSYCHIATRIC: The patient is alert and oriented x 2.  SKIN: No obvious rash, lesion, or ulcer.   Foley Cath in place and yellow urine draining.    LABORATORY PANEL:   CBC  Recent Labs Lab 03/01/16 0228  WBC 8.1  HGB 10.6*  HCT 31.1*  PLT 136*   ------------------------------------------------------------------------------------------------------------------  Chemistries   Recent Labs Lab 02/28/16 0405 02/28/16 1702 02/29/16 0418  NA 139  --  145  K 3.1*  --  3.8  CL 105  --  113*  CO2 28  --  28  GLUCOSE 142*  --  107*  BUN 19  --  19  CREATININE 1.04  --  0.99  CALCIUM 8.3*  --  8.4*  MG 2.3 2.3  --   AST 23  --   --   ALT 16*  --   --   ALKPHOS 77  --   --   BILITOT 1.0  --   --    ------------------------------------------------------------------------------------------------------------------  Cardiac Enzymes  Recent Labs Lab 02/28/16 0955  TROPONINI <0.03   ------------------------------------------------------------------------------------------------------------------  RADIOLOGY:  Dg Chest Port 1 View  Result Date: 02/29/2016 CLINICAL DATA:  Altered mental status.  Respiratory failure EXAM: PORTABLE CHEST 1 VIEW COMPARISON:  02/27/2016 FINDINGS: A single AP portable view of the chest demonstrates no focal airspace consolidation or alveolar edema.  The lungs are grossly clear. There is no large effusion or pneumothorax. Cardiac and mediastinal contours appear unremarkable. IMPRESSION: No active disease. Electronically Signed   By: Ellery Plunkaniel R Mitchell M.D.   On: 02/29/2016 23:37     ASSESSMENT AND PLAN:   65 year old male with past medical history of dementia, bipolar disorder,  depression, GERD/anxiety who presented to the hospital due to altered mental status.  1. AMS - etiology unclear presently.  Much improved now and back to baseline. ?? Due to advanced dementia which pt. Has a hx of.  - seen by neuro and they are trying to r/o seizures.  - EEG pending. Appreciate Neurlogy input.  -Other neurologic workup has been negative. MRI negative.   2. Status post cardiac arrest-etiology also unclear. -Cardiology following the patient and patient noted to have a mural thrombus on echocardiogram currently on heparin drip.  Cont. Coumadin. INR 1.1 today.   3. Mural Thrombus - cont. Heparin, coumadin and follow to INR 2-3.  - INR 1.1 today.   4. DM - cont. SSI and BS stable for now.   5. Dementia - cont. Aricept/Namenda  6. Bipolar Disorder - cont. Risperdal  7. Acute REsp. Failure w/ Hypoxia - pt. Was intubated due to cardiac arrest. - Patient choked on a sandwich overnight. Chest x-ray negative for aspiration or foreign body. Seen by speech and started on a dysphagia 3 diet. -Continue empiric Zosyn, and will switch to oral Augmentin in the next few days.   All the records are reviewed and case discussed with Care Management/Social Worker. Management plans discussed with the patient, family and they are in agreement.  CODE STATUS: Full  DVT Prophylaxis: Heparin and Coumadin  TOTAL TIME TAKING CARE OF THIS PATIENT: 25 minutes.   POSSIBLE D/C IN 2-3 DAYS, DEPENDING ON CLINICAL CONDITION.   Houston SirenSAINANI,Magon Croson J M.D on 03/01/2016 at 2:50 PM  Between 7am to 6pm - Pager - [phone]  After 6pm go to [email or web address] - Social research officer, governmentpassword EPAS [email or web address]  [facility] [place] [facility]  Office  [phone]  CC: Primary care physician; Evelene CroonNIEMEYER, MEINDERT, MD

## 2016-03-02 LAB — CBC
HCT: 30.2 % — ABNORMAL LOW (ref 40.0–52.0)
Hemoglobin: 10.7 g/dL — ABNORMAL LOW (ref 13.0–18.0)
MCH: 34.4 pg — AB (ref 26.0–34.0)
MCHC: 35.4 g/dL (ref 32.0–36.0)
MCV: 97.1 fL (ref 80.0–100.0)
PLATELETS: 127 10*3/uL — AB (ref 150–440)
RBC: 3.11 MIL/uL — AB (ref 4.40–5.90)
RDW: 12.7 % (ref 11.5–14.5)
WBC: 8.5 10*3/uL (ref 3.8–10.6)

## 2016-03-02 LAB — GLUCOSE, CAPILLARY
GLUCOSE-CAPILLARY: 89 mg/dL (ref 65–99)
GLUCOSE-CAPILLARY: 115 mg/dL — AB (ref 65–99)
GLUCOSE-CAPILLARY: 89 mg/dL (ref 65–99)
Glucose-Capillary: 94 mg/dL (ref 65–99)
Glucose-Capillary: 129 mg/dL — ABNORMAL HIGH (ref 65–99)

## 2016-03-02 LAB — PROTIME-INR
INR: 1.38
PROTHROMBIN TIME: 17.1 s — AB (ref 11.4–15.2)

## 2016-03-02 LAB — HEPARIN LEVEL (UNFRACTIONATED): Heparin Unfractionated: 0.19 IU/mL — ABNORMAL LOW (ref 0.30–0.70)

## 2016-03-02 MED ORDER — WARFARIN SODIUM 5 MG PO TABS
7.5000 mg | ORAL_TABLET | Freq: Every day | ORAL | Status: AC
Start: 2016-03-02 — End: 2016-03-02
  Administered 2016-03-02: 7.5 mg via ORAL
  Filled 2016-03-02: qty 2

## 2016-03-02 MED ORDER — HEPARIN BOLUS VIA INFUSION
2000.0000 [IU] | Freq: Once | INTRAVENOUS | Status: AC
  Administered 2016-03-02: 2000 [IU] via INTRAVENOUS
  Filled 2016-03-02: qty 2000

## 2016-03-02 MED ORDER — AMOXICILLIN-POT CLAVULANATE 875-125 MG PO TABS
1.0000 | ORAL_TABLET | Freq: Two times a day (BID) | ORAL | Status: DC
  Administered 2016-03-04: 1 via ORAL
  Filled 2016-03-02: qty 1

## 2016-03-02 MED ORDER — ENOXAPARIN SODIUM 80 MG/0.8ML ~~LOC~~ SOLN
1.0000 mg/kg | Freq: Two times a day (BID) | SUBCUTANEOUS | Status: DC
  Administered 2016-03-02 – 2016-03-04 (×5): 70 mg via SUBCUTANEOUS
  Filled 2016-03-02 (×5): qty 0.8

## 2016-03-02 MED ORDER — INSULIN ASPART 100 UNIT/ML ~~LOC~~ SOLN
0.0000 [IU] | Freq: Three times a day (TID) | SUBCUTANEOUS | Status: DC
  Administered 2016-03-02 – 2016-03-04 (×2): 1 [IU] via SUBCUTANEOUS
  Filled 2016-03-02 (×2): qty 1

## 2016-03-02 MED FILL — Medication: Qty: 1 | Status: AC

## 2016-03-02 NOTE — Progress Notes (Signed)
CSW was informed by PT that patient will need STR at discharge. CSW will reassess tomorrow 03/03/16.  Woodroe Modehristina Whitleigh Garramone, MSW, LCSW, LCAS-A Clinical Social Worker [phone]

## 2016-03-02 NOTE — Plan of Care (Signed)
Problem: Physical Regulation: Goal: Will remain free from infection Outcome: Not Progressing IV antibiotics  Problem: Tissue Perfusion: Goal: Risk factors for ineffective tissue perfusion will decrease Outcome: Progressing SQ lovenox

## 2016-03-02 NOTE — Evaluation (Signed)
Physical Therapy Evaluation Patient Details Name: Warren NeighborRalph M Boldin Jr. MRN: [id number] DOB: 01/18/1951 Today's Date: 03/02/2016   History of Present Illness  Pt is a 65 y.o. male presenting to hospital 02/26/16 with AMS and dysuria.  Pt had fall at doorstep day prior without noted injury.  Pt with code blue 02/26/16 and transferred to ICU d/t asystolic unresponsiveness event (with L sided facial and tongue deviation), acute respiratory failure, and cardiac arrest and intubated.  Pt with seizure 9/7 PM (twitching of face and at times UE noted post seizure; becomes tachycardic).  Pt noted to be in sinus bradycardia in general.  ECHO shows EF 50% with left ventricular thrombus.  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   Pt extubated 02/28/16.  02/28/17 pt with rapid response secondary to aspirating sandwich into airway but turned into code blue d/t pt becoming unresponsive with thready pulse.  PMH includes anxiety, bipolar 1 disorder, depression, and GERD.  Clinical Impression  Prior to admission, per chart pt was independent with ambulation.  Pt lives at a group home.  Currently pt is min to mod assist x2 supine to sit; min to mod assist x2 to stand with RW; and min to mod assist x2 to ambulate 30 feet with RW.  Pt's HR at rest 60 bpm and noted to increase to 107 bpm after 30 feet of ambulation (nursing present and notified); pt's HR decreased back to 60 bpm within a few minutes sitting rest break.  Pt would benefit from skilled PT to address noted impairments and functional limitations.  Recommend pt discharge to STR when medically appropriate.    Follow Up Recommendations SNF    Equipment Recommendations   (TBD)    Recommendations for Other Services       Precautions / Restrictions Precautions Precautions: Fall Precaution Comments: HOB >30 degrees Restrictions Weight Bearing Restrictions: No      Mobility  Bed Mobility Overal bed mobility: Needs Assistance Bed Mobility: Supine to  Sit     Supine to sit: Min assist;Mod assist;+2 for physical assistance;HOB elevated     General bed mobility comments: assist for trunk and B LE's; tactile cues required to initiate movement; increased time required  Transfers Overall transfer level: Needs assistance Equipment used: Rolling walker (2 wheeled) Transfers: Sit to/from Stand Sit to Stand: Min assist;Mod assist;+2 physical assistance         General transfer comment: x3 trials from bed; vc's required for hand and feet placement and walker use; posterior lean with standing; increased effort to stand noted  Ambulation/Gait Ambulation/Gait assistance: Min assist;Mod assist;+2 physical assistance Ambulation Distance (Feet): 30 Feet Assistive device: Rolling walker (2 wheeled)   Gait velocity: decreased   General Gait Details: pt requiring vc's for upright posture and to stay closer to RW;  vc's for safe walker use required as well as assist for navigation of RW; limited distance d/t fatigue and elevated HR  Stairs            Wheelchair Mobility    Modified Rankin (Stroke Patients Only)       Balance Overall balance assessment: Needs assistance Sitting-balance support: Bilateral upper extremity supported;Feet supported Sitting balance-Leahy Scale: Fair     Standing balance support: Bilateral upper extremity supported (on RW) Standing balance-Leahy Scale: Poor Standing balance comment: fluctuating between posterior and anterior lean  Pertinent Vitals/Pain Pain Assessment: Faces Faces Pain Scale: No hurt Pain Intervention(s): Limited activity within patient's tolerance;Monitored during session;Repositioned  O2 WFL during PT session on room air.    Home Living Family/patient expects to be discharged to:: Group home                 Additional Comments: Pt did not answer questions regarding home situation when asked.    Prior Function            Comments: Per chart pt independent with ambulation and talkative prior to admission (pt did not answer PLOF questions when asked)     Hand Dominance        Extremity/Trunk Assessment   Upper Extremity Assessment: Generalized weakness;Difficult to assess due to impaired cognition           Lower Extremity Assessment: Generalized weakness;Difficult to assess due to impaired cognition         Communication   Communication: Expressive difficulties (pt requiring extended time to answer questions (if pt did answer questions was one word responses); minimal verbal responses noted during session)  Cognition Arousal/Alertness: Awake/alert Behavior During Therapy: Flat affect Overall Cognitive Status: No family/caregiver present to determine baseline cognitive functioning                      General Comments General comments (skin integrity, edema, etc.): Pt laying in bed and agreeable to PT session.   Nursing cleared pt for participation in physical therapy (nursing reports pt no longer on IV heparin d/t bridging to another medication earlier today).  Pt agreeable to PT session.     Exercises        Assessment/Plan    PT Assessment Patient needs continued PT services  PT Diagnosis Difficulty walking;Generalized weakness   PT Problem List Decreased strength;Decreased activity tolerance;Decreased balance;Decreased mobility;Decreased knowledge of use of DME;Decreased safety awareness;Decreased knowledge of precautions  PT Treatment Interventions DME instruction;Gait training;Stair training;Functional mobility training;Therapeutic activities;Therapeutic exercise;Balance training;Patient/family education   PT Goals (Current goals can be found in the Care Plan section) Acute Rehab PT Goals Patient Stated Goal: no goal stated but pt did nod that he wanted to try to walk PT Goal Formulation: With patient Time For Goal Achievement: 03/16/16 Potential to Achieve Goals:  Good    Frequency Min 2X/week   Barriers to discharge Decreased caregiver support      Co-evaluation               End of Session Equipment Utilized During Treatment: Gait belt Activity Tolerance: Patient limited by fatigue;Other (comment) (Limited d/t elevated HR.) Patient left: in chair;with call bell/phone within reach;with chair alarm set Nurse Communication: Mobility status;Precautions (HR elevated with activity)         Time: 1428-1500 PT Time Calculation (min) (ACUTE ONLY): 32 min   Charges:   PT Evaluation $PT Eval Low Complexity: 1 Procedure PT Treatments $Therapeutic Activity: 8-22 mins   PT G CodesHendricks Limes 03-21-16, 4:02 PM Hendricks Limes, PT [phone]

## 2016-03-02 NOTE — Progress Notes (Signed)
Pharmacy Antibiotic Note  Warren NeighborRalph M Rzasa Jr. is a 65 y.o. male admitted on 02/26/2016 with respiratory failure - possible UTI/pneumonia.  Pharmacy has been consulted for piperacillin/tazobactam dosing.  This is day #5 of antibiotics.  Plan: Continue piperacillin/tazobactam 3.375 g IV q8h EI. Will discuss d/c abx with physician with CXR negative for infection.   Height: 6' (182.9 cm) Weight: 152 lb 1.6 oz (69 kg) IBW/kg (Calculated) : 77.6  Temp (24hrs), Avg:98.2 F (36.8 C), Min:98 F (36.7 C), Max:98.4 F (36.9 C)   Recent Labs Lab 02/26/16 1032 02/27/16 0105 02/28/16 0405 02/29/16 0418 03/01/16 0228 03/02/16 0422  WBC 9.6 10.7* 8.1 6.6 8.1 8.5  CREATININE 0.98 0.89 1.04 0.99  --   --     Estimated Creatinine Clearance: 72.6 mL/min (by C-G formula based on SCr of 0.99 mg/dL).    No Known Allergies  Antimicrobials this admission: Piperacillin/tazobactam 9/7 >>   Dose adjustments this admission:  Microbiology results: 9/7 UCx: > 100k viridans strep 9/7 MRSA PCR: Negative  Thank you for allowing pharmacy to be a part of this patient's care.  Luisa Harthristy, Zennie Ayars D, PharmD Clinical Pharmacist 03/02/2016 1:31 PM

## 2016-03-02 NOTE — Progress Notes (Signed)
[employer] - [place] at [facility]   PATIENT NAME: Warren BienRalph [name]    MR#:  [id number]  DATE OF BIRTH:  11/29/1950  SUBJECTIVE:   No acute events overnight.  Tolerating dysphagia III diet well.  No other complaints or events overnight.    REVIEW OF SYSTEMS:    Review of Systems  Constitutional: Negative for chills and fever.  HENT: Negative for congestion and tinnitus.   Eyes: Negative for blurred vision and double vision.  Respiratory: Negative for cough, shortness of breath and wheezing.   Cardiovascular: Negative for chest pain, orthopnea and PND.  Gastrointestinal: Negative for abdominal pain, diarrhea, nausea and vomiting.  Genitourinary: Negative for dysuria and hematuria.  Neurological: Negative for dizziness, sensory change and focal weakness.  All other systems reviewed and are negative.   Nutrition: Dysphagia III with thin liquids Tolerating Diet: Yes Tolerating PT: Await Eval   DRUG ALLERGIES:  No Known Allergies  VITALS:  Blood pressure (!) 109/49, pulse 64, temperature 98.2 F (36.8 C), temperature source Oral, resp. rate 18, height 6' (1.829 m), weight 69 kg (152 lb 1.6 oz), SpO2 91 %.  PHYSICAL EXAMINATION:   Physical Exam  GENERAL:  65 y.o.-year-old patient lying in the bed in NAD.  EYES: Pupils equal, round, reactive to light and accommodation. No scleral icterus. Extraocular muscles intact.  HEENT: Head atraumatic, normocephalic. Oropharynx and nasopharynx clear.  NECK:  Supple, no jugular venous distention. No thyroid enlargement, no tenderness.  LUNGS: Normal breath sounds bilaterally, no wheezing, rales, rhonchi. No use of accessory muscles of respiration.  CARDIOVASCULAR: S1, S2 normal. No murmurs, rubs, or gallops.  ABDOMEN: Soft, nontender, nondistended. Bowel sounds present. No organomegaly or mass.  EXTREMITIES: No cyanosis, clubbing or edema b/l.    NEUROLOGIC: Cranial nerves II through XII are intact. No focal Motor or sensory  deficits b/l. Globally weak.   PSYCHIATRIC: The patient is alert and oriented x 2.  SKIN: No obvious rash, lesion, or ulcer.    LABORATORY PANEL:   CBC  Recent Labs Lab 03/02/16 0422  WBC 8.5  HGB 10.7*  HCT 30.2*  PLT 127*   ------------------------------------------------------------------------------------------------------------------  Chemistries   Recent Labs Lab 02/28/16 0405 02/28/16 1702 02/29/16 0418  NA 139  --  145  K 3.1*  --  3.8  CL 105  --  113*  CO2 28  --  28  GLUCOSE 142*  --  107*  BUN 19  --  19  CREATININE 1.04  --  0.99  CALCIUM 8.3*  --  8.4*  MG 2.3 2.3  --   AST 23  --   --   ALT 16*  --   --   ALKPHOS 77  --   --   BILITOT 1.0  --   --    ------------------------------------------------------------------------------------------------------------------  Cardiac Enzymes  Recent Labs Lab 02/28/16 0955  TROPONINI <0.03   ------------------------------------------------------------------------------------------------------------------  RADIOLOGY:  Dg Chest Port 1 View  Result Date: 02/29/2016 CLINICAL DATA:  Altered mental status.  Respiratory failure EXAM: PORTABLE CHEST 1 VIEW COMPARISON:  02/27/2016 FINDINGS: A single AP portable view of the chest demonstrates no focal airspace consolidation or alveolar edema. The lungs are grossly clear. There is no large effusion or pneumothorax. Cardiac and mediastinal contours appear unremarkable. IMPRESSION: No active disease. Electronically Signed   By: Ellery Plunkaniel R Mitchell M.D.   On: 02/29/2016 23:37     ASSESSMENT AND PLAN:   65 year old male with past medical history of dementia, bipolar disorder, depression,  GERD/anxiety who presented to the hospital due to altered mental status.  1. AMS - etiology unclear presently.  Much improved now and back to baseline. ?? Due to advanced dementia which pt. Has a hx of.  - seen by neuro and they are trying to r/o seizures.  - EEG pending. Appreciate  Neurlogy input.  -Other neurologic workup has been negative. MRI negative.   2. Status post cardiac arrest-etiology also unclear. -Cardiology following the patient and patient noted to have a mural thrombus on echocardiogram - changed from Heparin gtt to Lovenox and cont. Coumadin and INR is 1.3.   3. Mural Thrombus - cont. Lovenox, coumadin and follow to INR 2-3.  - INR 1.3 today.   4. DM - cont. SSI and BS stable for now.   5. Dementia - cont. Aricept/Namenda  6. Bipolar Disorder - cont. Risperdal  7. Acute REsp. Failure w/ Hypoxia - pt. Was intubated due to cardiac arrest. - Patient choked on a sandwich 2 days ago. Chest x-ray was negative for aspiration or foreign body. Seen by speech and started on a dysphagia 3 diet and tolerating it well.  - will d/c Zosyn and start on Oral Augmentin for 2 more days.   Await PT eval.   All the records are reviewed and case discussed with Care Management/Social Worker. Management plans discussed with the patient, family and they are in agreement.  CODE STATUS: Full  DVT Prophylaxis: Heparin and Coumadin  TOTAL TIME TAKING CARE OF THIS PATIENT: 25 minutes.   POSSIBLE D/C IN 2-3 DAYS, DEPENDING ON CLINICAL CONDITION.   [place] Siren M.D on 03/02/2016 at 2:45 PM  Between 7am to 6pm - Pager - [phone]  After 6pm go to [email or web address] - Social research officer, government  [facility] [place] [facility]  Office  [phone]  CC: Primary care physician; Evelene Croon, MD

## 2016-03-02 NOTE — Care Management (Signed)
PT consult still pending due to heparin drip.  Discussed during progression that patient's meals must now be supervised as the reason for choking on previous day was due to patient shoving the whole sandwich in his mouth.  INR today 1.38.  Will need to determine whether patient's care needs can be managed by the group home.  Home health services will certainly be needed if able to discharge directly back to that environment.  Anticipate nursing and physical therapy

## 2016-03-02 NOTE — Progress Notes (Signed)
ANTICOAGULATION CONSULT NOTE -Follow up Consult  Pharmacy Consult for Heparin/Warfarin Indication: Left ventricular thrombus  No Known Allergies  Patient Measurements: Height: 6' (182.9 cm) Weight: 148 lb 11.2 oz (67.4 kg) IBW/kg (Calculated) : 77.6 Heparin Dosing Weight: 65.5 kg   Vital Signs: Temp: 98 F (36.7 C) (09/12 0433) Temp Source: Oral (09/11 2003) BP: 127/58 (09/12 0433) Pulse Rate: 55 (09/12 0433)  Labs:  Recent Labs  02/28/16 0955  02/29/16 0418  02/29/16 1756 03/01/16 0228 03/02/16 0422  HGB  --   < > 10.6*  --   --  10.6* 10.7*  HCT  --   --  30.1*  --   --  31.1* 30.2*  PLT  --   --  119*  --   --  136* 127*  LABPROT  --   --  14.4  --   --  14.5 17.1*  INR  --   --  1.11  --   --  1.12 1.38  HEPARINUNFRC  --   < > 0.23*  < > 0.38 0.37 0.19*  CREATININE  --   --  0.99  --   --   --   --   TROPONINI <0.03  --   --   --   --   --   --   < > = values in this interval not displayed.  Estimated Creatinine Clearance: 71 mL/min (by C-G formula based on SCr of 0.99 mg/dL).   Assessment: Pharmacy consulted to dose heparin/warfarin in this 65 year old male with left ventricular thrombus. Patient on no anticoagulant medication at group home.  9/8   INR: 1.12 9/9   INR:  No INR   No Warfarin given 9/10 INR: 1.10     Warfarin 5 mg 9/11 INR: 1.12   Goal of Therapy:  INR 2.5-3 Heparin level 0.3-0.7 units/ml Monitor platelets by anticoagulation protocol: Yes   Plan:   -Heparin: Current orders for heparin 850 units/hr. Heparin level therapeutic x2. Will recheck in AM.   9/11 AM heparin level 0.37. Continue current regimen. Recheck heparin level and CBC tomorrow AM.   -Warfarin: Patient to begin warfarin 9/9, ordered 5mg , however dose not given. Will initiate today at 5mg . F/U INR with AM labs  9/11 AM INR 1.12. Will increase Warfarin to 7.5 mg x 1 dose. F/u INR in am.  9/12 0422 heparin level subtherapeutic. 2000 units IV x 1 bolus and increase rate  to 1050 units/hr. Will recheck heparin level in 6 hours.  Carola FrostNathan A Meris Reede, Pharm.D., BCPS Clinical Pharmacist 03/02/2016,5:53 AM

## 2016-03-02 NOTE — Progress Notes (Signed)
  SUBJECTIVE: The patient is resting in bed and denies any chest pain or shortness of breath.  Vitals:   03/01/16 1101 03/01/16 2003 03/02/16 0433 03/02/16 0744  BP: (!) 145/61 (!) 129/49 (!) 127/58 (!) 159/54  Pulse: (!) 59 (!) 59 (!) 55 (!) 49  Resp: 16 20 18 15   Temp: 98 F (36.7 C) 98.2 F (36.8 C) 98 F (36.7 C) 98.4 F (36.9 C)  TempSrc:  Oral  Oral  SpO2: 100% 100% 99% 99%  Weight:   152 lb 1.6 oz (69 kg)   Height:        Intake/Output Summary (Last 24 hours) at 03/02/16 0820 Last data filed at 03/02/16 0700  Gross per 24 hour  Intake          2326.25 ml  Output             1700 ml  Net           626.25 ml    LABS: Basic Metabolic Panel:  Recent Labs  40/98/1108/03/07 1702 02/29/16 0418  NA  --  145  K  --  3.8  CL  --  113*  CO2  --  28  GLUCOSE  --  107*  BUN  --  19  CREATININE  --  0.99  CALCIUM  --  8.4*  MG 2.3  --   PHOS 3.4  --    Liver Function Tests: No results for input(s): AST, ALT, ALKPHOS, BILITOT, PROT, ALBUMIN in the last 72 hours. No results for input(s): LIPASE, AMYLASE in the last 72 hours. CBC:  Recent Labs  03/01/16 0228 03/02/16 0422  WBC 8.1 8.5  HGB 10.6* 10.7*  HCT 31.1* 30.2*  MCV 99.3 97.1  PLT 136* 127*   Cardiac Enzymes:  Recent Labs  02/28/16 0955  TROPONINI <0.03   BNP: Invalid input(s): POCBNP D-Dimer: No results for input(s): DDIMER in the last 72 hours. Hemoglobin A1C: No results for input(s): HGBA1C in the last 72 hours. Fasting Lipid Panel:  Recent Labs  03/01/16 0228  TRIG 33   Thyroid Function Tests: No results for input(s): TSH, T4TOTAL, T3FREE, THYROIDAB in the last 72 hours.  Invalid input(s): FREET3 Anemia Panel: No results for input(s): VITAMINB12, FOLATE, FERRITIN, TIBC, IRON, RETICCTPCT in the last 72 hours.   PHYSICAL EXAM General: Thin, in no acute distress HEENT:  Normocephalic and atramatic Neck:  No JVD.  Lungs: Clear bilaterally to auscultation and percussion. Heart: HRRR .  Normal S1 and S2 without gallops or murmurs.  Abdomen: Bowel sounds are positive, abdomen soft and non-tender  Msk:  Back normal, normal gait. Normal strength and tone for age. Extremities: No clubbing, cyanosis or edema.   Neuro: Alert and oriented X 3. Psych:  Good affect, responds appropriately  TELEMETRY: Sinus rhythm/sinus bradycardia with rates in the 50s and 60s  ASSESSMENT AND PLAN: Sinus bradycardia/CVA/status post respiratory failure/left ventricular apical thrombus. The patient is on heparin drip with bridge to warfarin therapy. We will follow-up as an outpatient in the office and manage the INR.  Principal Problem:   Altered mental status Active Problems:   Respiratory failure (HCC)    Berton BonJanine Hershall Benkert, NP 03/02/2016 8:20 AM

## 2016-03-02 NOTE — Progress Notes (Signed)
ANTICOAGULATION CONSULT NOTE -Follow up Consult  Pharmacy Consult for Heparin/Warfarin Indication: Left ventricular thrombus  No Known Allergies  Patient Measurements: Height: 6' (182.9 cm) Weight: 152 lb 1.6 oz (69 kg) IBW/kg (Calculated) : 77.6 Heparin Dosing Weight: 65.5 kg   Vital Signs: Temp: 98.2 F (36.8 C) (09/12 1138) Temp Source: Oral (09/12 1138) BP: 109/49 (09/12 1138) Pulse Rate: 64 (09/12 1138)  Labs:  Recent Labs  02/29/16 0418  02/29/16 1756 03/01/16 0228 03/02/16 0422  HGB 10.6*  --   --  10.6* 10.7*  HCT 30.1*  --   --  31.1* 30.2*  PLT 119*  --   --  136* 127*  LABPROT 14.4  --   --  14.5 17.1*  INR 1.11  --   --  1.12 1.38  HEPARINUNFRC 0.23*  < > 0.38 0.37 0.19*  CREATININE 0.99  --   --   --   --   < > = values in this interval not displayed.  Estimated Creatinine Clearance: 72.6 mL/min (by C-G formula based on SCr of 0.99 mg/dL).   Assessment: Pharmacy consulted to dose heparin/warfarin in this 65 year old male with left ventricular thrombus. Patient on no anticoagulant medication at group home.  9/8   INR: 1.12 9/9   INR:  No INR   No Warfarin given 9/10 INR: 1.10     Warfarin 5 mg 9/11 INR: 1.12     Warfarin 7.5 mg 9/12 INR: 1.38   Goal of Therapy:  INR 2.5-3 Monitor platelets by anticoagulation protocol: Yes   Plan:   -Heparin: Patient converted to Lovenox 1mg /kg q 12 h for bridging. Will continue with Coumadin 7.5 mg daily for now and f/u AM INR.  Luisa Harthristy, Bianna Haran D, Pharm.D., BCPS Clinical Pharmacist 03/02/2016,1:27 PM

## 2016-03-03 ENCOUNTER — Inpatient Hospital Stay: Payer: Medicare Other

## 2016-03-03 LAB — GLUCOSE, CAPILLARY
GLUCOSE-CAPILLARY: 93 mg/dL (ref 65–99)
GLUCOSE-CAPILLARY: 102 mg/dL — AB (ref 65–99)
Glucose-Capillary: 101 mg/dL — ABNORMAL HIGH (ref 65–99)
Glucose-Capillary: 94 mg/dL (ref 65–99)
Glucose-Capillary: 107 mg/dL — ABNORMAL HIGH (ref 65–99)

## 2016-03-03 LAB — PROTIME-INR
INR: 2.23
Prothrombin Time: 25.1 seconds — ABNORMAL HIGH (ref 11.4–15.2)

## 2016-03-03 MED ORDER — WARFARIN SODIUM 1 MG PO TABS
3.0000 mg | ORAL_TABLET | Freq: Once | ORAL | Status: AC
Start: 2016-03-03 — End: 2016-03-03
  Administered 2016-03-03: 3 mg via ORAL
  Filled 2016-03-03: qty 3

## 2016-03-03 NOTE — Progress Notes (Signed)
[facility] [place] [facility]   PATIENT NAME: Warren BienRalph [name]    MR#:  [id number]  DATE OF BIRTH:  11/17/1950  SUBJECTIVE:   A bit lethargic this a.m. And not following commands.    REVIEW OF SYSTEMS:    ROS  Nutrition: Dysphagia III with thin liquids Tolerating Diet: Yes Tolerating PT: Await Eval   DRUG ALLERGIES:  No Known Allergies  VITALS:  Blood pressure (!) 135/48, pulse (!) 57, temperature 97.3 F (36.3 C), resp. rate 19, height 6' (1.829 m), weight 68.7 kg (151 lb 8 oz), SpO2 96 %.  PHYSICAL EXAMINATION:   Physical Exam  GENERAL:  65 y.o.-year-old patient lying in the bed in NAD.  EYES: Pupils equal, round, reactive to light and accommodation. No scleral icterus. Extraocular muscles intact.  HEENT: Head atraumatic, normocephalic. Oropharynx and nasopharynx clear.  NECK:  Supple, no jugular venous distention. No thyroid enlargement, no tenderness.  LUNGS: Normal breath sounds bilaterally, no wheezing, rales, rhonchi. No use of accessory muscles of respiration.  CARDIOVASCULAR: S1, S2 normal. No murmurs, rubs, or gallops.  ABDOMEN: Soft, nontender, nondistended. Bowel sounds present. No organomegaly or mass.  EXTREMITIES: No cyanosis, clubbing or edema b/l.    NEUROLOGIC: Cranial nerves II through XII are intact. No focal Motor or sensory deficits b/l. Globally weak.   PSYCHIATRIC: The patient is alert and oriented x 1.  SKIN: No obvious rash, lesion, or ulcer.    LABORATORY PANEL:   CBC  Recent Labs Lab 03/02/16 0422  WBC 8.5  HGB 10.7*  HCT 30.2*  PLT 127*   ------------------------------------------------------------------------------------------------------------------  Chemistries   Recent Labs Lab 02/28/16 0405 02/28/16 1702 02/29/16 0418  NA 139  --  145  K 3.1*  --  3.8  CL 105  --  113*  CO2 28  --  28  GLUCOSE 142*  --  107*  BUN 19  --  19  CREATININE 1.04  --  0.99  CALCIUM 8.3*  --  8.4*  MG 2.3 2.3  --   AST  23  --   --   ALT 16*  --   --   ALKPHOS 77  --   --   BILITOT 1.0  --   --    ------------------------------------------------------------------------------------------------------------------  Cardiac Enzymes  Recent Labs Lab 02/28/16 0955  TROPONINI <0.03   ------------------------------------------------------------------------------------------------------------------  RADIOLOGY:  Ct Head Wo Contrast  Result Date: 03/03/2016 CLINICAL DATA:  65 year old male with history of altered mental status. EXAM: CT HEAD WITHOUT CONTRAST TECHNIQUE: Contiguous axial images were obtained from the base of the skull through the vertex without intravenous contrast. COMPARISON:  Head CT 02/26/2016. FINDINGS: Brain: Mild cerebral atrophy with ex vacuo dilatation of the ventricular system. Patchy and confluent areas of decreased attenuation are noted throughout the deep and periventricular white matter of the cerebral hemispheres bilaterally, compatible with chronic microvascular ischemic disease. No acute intracranial abnormalities. Specifically, no evidence of acute intracranial hemorrhage, no definite findings of acute/subacute cerebral ischemia, no mass, mass effect, hydrocephalus or abnormal intra or extra-axial fluid collections. Vascular: No hyperdense vessel or unexpected calcification. Skull: Normal. Negative for fracture or focal lesion. Sinuses/Orbits: No acute finding. Other: None. IMPRESSION: 1. No acute intracranial abnormalities. The appearance of the brain is unchanged, as above. Electronically Signed   By: Trudie Reedaniel  Entrikin M.D.   On: 03/03/2016 12:13     ASSESSMENT AND PLAN:   65 year old male with past medical history of dementia, bipolar disorder, depression, GERD/anxiety who presented to  the hospital due to altered mental status.  1. AMS - likely due to advanced dementia.  Quite lethargic and encephalopathic this morning. CT head obtained which is negative. - seen by neuro and they  are trying to r/o seizures.  - EEG pending. Appreciate Neurlogy input.  - MRI Brain has been   2. Status post cardiac arrest-etiology also unclear. -Cardiology following the patient and patient noted to have a mural thrombus on echocardiogram - cont. Lovenox and cont. Coumadin and INR is 2.2.     3. Mural Thrombus - cont. Lovenox, coumadin and follow to INR 2-3.  - INR 2.2 today.   4. DM - cont. SSI and BS stable for now.   5. Dementia - cont. Aricept/Namenda  6. Bipolar Disorder - cont. Risperdal  7. Acute REsp. Failure w/ Hypoxia - pt. Was intubated due to cardiac arrest. - Patient choked on a sandwich a few days ago. Chest x-ray was negative for aspiration or foreign body. Seen by speech and started on a dysphagia 3 diet and tolerating it well.  - cont. Augmentin for one more day.    PT eval noted and will d/c to SNF in next 1-2 days.   All the records are reviewed and case discussed with Care Management/Social Worker. Management plans discussed with the patient, family and they are in agreement.  CODE STATUS: Full  DVT Prophylaxis: Lovenox and Coumadin  TOTAL TIME TAKING CARE OF THIS PATIENT: 25 minutes.   POSSIBLE D/C IN 1-2 DAYS, DEPENDING ON CLINICAL CONDITION.   Houston Siren M.D on 03/03/2016 at 1:39 PM  Between 7am to 6pm - Pager - [phone]  After 6pm go to [email or web address] - Social research officer, government  [facility]  Office  [phone]  CC: Primary care physician; Evelene Croon, MD

## 2016-03-03 NOTE — Progress Notes (Signed)
Initial Nutrition Assessment  DOCUMENTATION CODES:   Non-severe (moderate) malnutrition in context of social or environmental circumstances  INTERVENTION:  -Feeding assistance at all meals, recommend holding meal tray if pt not alert enough to safely take po. SLP also notified of pt change in status and inability to safely tolerate po at lunch today -Recommend Magic Cup supplement, Ensure supplement as tolerated by pt  NUTRITION DIAGNOSIS:   Inadequate oral intake related to acute illness as evidenced by NPO status.  Being addressed as diet advanced, pt taking po as tolerated  GOAL:   Patient will meet greater than or equal to 90% of their needs  MONITOR:   PO intake, Diet advancement, Labs, I & O's, Skin, Supplement acceptance  REASON FOR ASSESSMENT:   Ventilator, Consult Enteral/tube feeding initiation and management  ASSESSMENT:   65 yo male admitted with AMS. Pt beceme unresponsive with agonal respirations on 9/7, code blue called and pt required intubation.   Pt resides in group home for the last 17 years,   Pt lethargic today, CT head negative, neurology following. Pt was arouseable enough to eat 50% of breakfast this AM, but RN not safe for po intake at lunch today Micah Noel(Lance RN fed pt 1 bite of lunch meal and pt pocketed/held in mouth and would not swallow so RN had to pull food out). Pt more alert on previous days and taking 75-100% of meal trays and tolerating Dysphagia III diet well.  Nutrition-Focused physical exam completed. Findings are mild to moderate fat depletion, mild to moderate muscle depletion, and no edema.   Diet Order:  DIET DYS 3 Room service appropriate? Yes; Fluid consistency: Thin  Skin:  Reviewed, no issues  Last BM:  9/11   Labs:  Glucose Profile:   Recent Labs  03/03/16 0357 03/03/16 0759 03/03/16 1234  GLUCAP 101* 93 94   Meds: reviewed  Height:   Ht Readings from Last 1 Encounters:  03/01/16 6' (1.829 m)    Weight:   Wt  Readings from Last 1 Encounters:  03/03/16 151 lb 8 oz (68.7 kg)    BMI:  Body mass index is 20.55 kg/m.  Estimated Nutritional Needs:   Kcal:  1594 kcals  Protein:  99-132 g  Fluid:  >/= 1.6 L  EDUCATION NEEDS:   No education needs identified at this time  Romelle StarcherCate Kadence Mikkelson MS, RD, LDN [phone] Pager  [phone] Weekend/On-Call Pager

## 2016-03-03 NOTE — Progress Notes (Signed)
ANTICOAGULATION CONSULT NOTE -Follow up Consult  Pharmacy Consult for Heparin/Warfarin Indication: Left ventricular thrombus  No Known Allergies  Patient Measurements: Height: 6' (182.9 cm) Weight: 151 lb 8 oz (68.7 kg) IBW/kg (Calculated) : 77.6 Heparin Dosing Weight: 65.5 kg   Vital Signs: Temp: 98.2 F (36.8 C) (09/13 0836) Temp Source: Oral (09/13 0836) BP: 130/60 (09/13 0836) Pulse Rate: 75 (09/13 0836)  Labs:  Recent Labs  02/29/16 1756 03/01/16 0228 03/02/16 0422 03/03/16 0400  HGB  --  10.6* 10.7*  --   HCT  --  31.1* 30.2*  --   PLT  --  136* 127*  --   LABPROT  --  14.5 17.1* 25.1*  INR  --  1.12 1.38 2.23  HEPARINUNFRC 0.38 0.37 0.19*  --     Estimated Creatinine Clearance: 72.3 mL/min (by C-G formula based on SCr of 0.99 mg/dL).   Assessment: Pharmacy consulted to dose heparin/warfarin in this 65 year old male with left ventricular thrombus. Patient on no anticoagulant medication at group home.  9/8   INR: 1.12 9/9   INR:  No INR   No Warfarin given 9/10 INR: 1.10     Warfarin 5 mg 9/11 INR: 1.12     Warfarin 7.5 mg 9/12 INR: 1.38     Warfarin 7.5mg  9/13 INR: 2.23   Goal of Therapy:  INR 2.5-3 Monitor platelets by anticoagulation protocol: Yes   Plan:  INR therapeutic today, up to 2.23 from 1.38 yesterday. Will reduce dose to 3mg  today to avoid overshooting INR.   Plan to continue enoxaparin until day 5 of warfarin therapy with a therapeutic INR x 48 hours.   Duward Allbritton C, Pharm.D Clinical Pharmacist 03/03/2016,10:30 AM

## 2016-03-03 NOTE — Progress Notes (Signed)
  SUBJECTIVE: The patient is very sleepy this morning, but arouses enough to deny chest pain or shortness of breath.   Vitals:   03/02/16 1450 03/02/16 1959 03/03/16 0357 03/03/16 0836  BP:  (!) 103/48 (!) 122/52 130/60  Pulse: (!) 107 60 (!) 57 75  Resp:  16 14 16   Temp:  98.9 F (37.2 C) 98.4 F (36.9 C) 98.2 F (36.8 C)  TempSrc:  Oral Oral Oral  SpO2: 93% 98% 95% 96%  Weight:   151 lb 8 oz (68.7 kg)   Height:        Intake/Output Summary (Last 24 hours) at 03/03/16 0915 Last data filed at 03/03/16 0826  Gross per 24 hour  Intake             1200 ml  Output                0 ml  Net             1200 ml    LABS: Basic Metabolic Panel: No results for input(s): NA, K, CL, CO2, GLUCOSE, BUN, CREATININE, CALCIUM, MG, PHOS in the last 72 hours. Liver Function Tests: No results for input(s): AST, ALT, ALKPHOS, BILITOT, PROT, ALBUMIN in the last 72 hours. No results for input(s): LIPASE, AMYLASE in the last 72 hours. CBC:  Recent Labs  03/01/16 0228 03/02/16 0422  WBC 8.1 8.5  HGB 10.6* 10.7*  HCT 31.1* 30.2*  MCV 99.3 97.1  PLT 136* 127*   Cardiac Enzymes: No results for input(s): CKTOTAL, CKMB, CKMBINDEX, TROPONINI in the last 72 hours. BNP: Invalid input(s): POCBNP D-Dimer: No results for input(s): DDIMER in the last 72 hours. Hemoglobin A1C: No results for input(s): HGBA1C in the last 72 hours. Fasting Lipid Panel:  Recent Labs  03/01/16 0228  TRIG 33   Thyroid Function Tests: No results for input(s): TSH, T4TOTAL, T3FREE, THYROIDAB in the last 72 hours.  Invalid input(s): FREET3 Anemia Panel: No results for input(s): VITAMINB12, FOLATE, FERRITIN, TIBC, IRON, RETICCTPCT in the last 72 hours.   PHYSICAL EXAM General: Well developed, well nourished, in no acute distress HEENT:  Normocephalic and atramatic Neck:  No JVD.  Lungs: Clear bilaterally to auscultation and percussion. Heart: HRRR . Normal S1 and S2 without gallops or murmurs.  Abdomen:  Bowel sounds are positive, abdomen soft and non-tender  Msk:  Back normal, normal gait. Normal strength and tone for age. Extremities: No clubbing, cyanosis or edema.   Neuro: Alert and oriented X 3. Psych:  Good affect, responds appropriately  TELEMETRY: Normal sinus rhythm 77 bpm  ASSESSMENT AND PLAN: Patient admitted with sinus bradycardia/CVA/status post respiratory failure/left ventricular apical thrombus. LV systolic function is 50% by echo. The heart rate is better and the INR is now therapeutic and heparin drip has been discontinued. Cardiac standpoint, the patient may be discharged with outpatient follow-up in our office and management of INR within approximately 2 days after discharge.  Principal Problem:   Altered mental status Active Problems:   Respiratory failure (HCC)    Warren BonJanine Chriss Redel, NP 03/03/2016 9:15 AM

## 2016-03-03 NOTE — Care Management (Signed)
INR therapuetic.  Anticipate discharge to skilled nursing facility.  Cardiology has cleared and CSW has initiated bed search.  Would anticipate discharge within the next 24 hours if remains stable

## 2016-03-03 NOTE — Progress Notes (Signed)
Patient is more awake this evening,ate dinner and took his meds without difficulty,vital signs within normal limits,remains very weak and PT is recomending rehab.

## 2016-03-03 NOTE — Progress Notes (Signed)
Patient is oriented X1. CSW contacted [name] (Listed on patient's facesheet). Per Ethelene BrownsAnthony he will ask patient if he's interested in SNF placement. Rep[facility] that [facility]. CSW informed [name] that [facility] has to assess patient's needs to determine if they can meet them. Granted CSW verbal permission to send SNF referral to SNFs in [place]. FL2/ PASRR completed. Awaiting bed offers.  Woodroe Modehristina Margaruite Top, MSW, LCSW, LCAS-A Clinical Social Worker [phone]

## 2016-03-03 NOTE — Care Management Important Message (Signed)
Important Message  Patient Details  Name: [name] MRN: [id number] Date of Birth: 06/02/1951   Medicare Important Message Given:  Yes    Eber HongGreene, Akansha Wyche R, RN 03/03/2016, 3:39 PM

## 2016-03-03 NOTE — NC FL2 (Addendum)
[place] MEDICAID FL2 LEVEL OF CARE SCREENING TOOL     IDENTIFICATION  Patient Name: Warren [name]. Birthdate: [birth year] Sex: male Admission Date (Current Location): 02/26/2016  [place] and [place] Number:  Chiropodist and Address:  [facility], [address], [place], [place] [address]      Provider Number: [id number]  Attending Physician Name and Address:  Houston Siren, MD  Relative Name and Phone Number:       Current Level of Care: Hospital Recommended Level of Care: Skilled Nursing Facility Prior Approval Number:    Date Approved/Denied:   PASRR Number:  [id number])  Discharge Plan: SNF    Current Diagnoses: Patient Active Problem List   Diagnosis Date Noted  . Altered mental status 02/26/2016  . Respiratory failure (HCC) 02/26/2016    Orientation RESPIRATION BLADDER Height & Weight     Self  Normal Incontinent Weight: 151 lb 8 oz (68.7 kg) Height:  6' (182.9 cm)  BEHAVIORAL SYMPTOMS/MOOD NEUROLOGICAL BOWEL NUTRITION STATUS   (None)  (None) Incontinent Diet (DYS 3)  AMBULATORY STATUS COMMUNICATION OF NEEDS Skin   Extensive Assist Verbally Normal                       Personal Care Assistance Level of Assistance  Bathing, Feeding, Dressing Bathing Assistance: Limited assistance Limited assistance: Independent Dressing Assistance: Limited assistance     Functional Limitations Info  Sight, Hearing, Speech Sight Info: Adequate Hearing Info: Adequate Speech Info: Adequate    SPECIAL CARE FACTORS FREQUENCY  PT (By licensed PT), OT (By licensed OT)     PT Frequency:  (5) OT Frequency:  (5)            Contractures      Additional Factors Info  Code Status, Allergies, Insulin Sliding Scale Code Status Info:  (Full Code) Allergies Info:  (No Known Allergies)   Insulin Sliding Scale Info:  (insulin aspart (novoLOG) injection 0-9 Units 0-9 Units, Subcutaneous, 3 times daily before meals & bedtime  )       Current Medications (03/03/2016):  This is the current hospital active medication list Current Facility-Administered Medications  Medication Dose Route Frequency Provider Last Rate Last Dose  . 0.9 %  sodium chloride infusion  250 mL Intravenous PRN Erin Fulling, MD      . 0.9 %  sodium chloride infusion  250 mL Intravenous PRN Erin Fulling, MD      . acetaminophen (TYLENOL) tablet 650 mg  650 mg Oral Q4H PRN Erin Fulling, MD      . ALPRAZolam Prudy Feeler) tablet 1 mg  1 mg Oral BID PRN Altamese Dilling, MD      . Melene Muller ON 03/04/2016] amoxicillin-clavulanate (AUGMENTIN) 875-125 MG per tablet 1 tablet  1 tablet Oral Q12H Houston Siren, MD      . atorvastatin (LIPITOR) tablet 40 mg  40 mg Oral q1800 Altamese Dilling, MD   40 mg at 03/02/16 1709  . benztropine (COGENTIN) tablet 1 mg  1 mg Oral QHS Altamese Dilling, MD   1 mg at 03/02/16 2149  . chlorhexidine (PERIDEX) 0.12 % solution 15 mL  15 mL Mouth Rinse BID Bincy S Varughese, NP   15 mL at 03/03/16 0933  . chlorproMAZINE (THORAZINE) tablet 50 mg  50 mg Oral BID Altamese Dilling, MD   50 mg at 03/03/16 0933  . memantine (NAMENDA XR) 24 hr capsule 28 mg  28 mg Oral Daily Altamese Dilling,  MD   28 mg at 03/03/16 0932   And  . donepezil (ARICEPT) tablet 10 mg  10 mg Oral Daily Altamese DillingVaibhavkumar Vachhani, MD   10 mg at 03/03/16 0933  . enoxaparin (LOVENOX) injection 70 mg  1 mg/kg Subcutaneous Q12H Houston SirenVivek J Sainani, MD   70 mg at 03/03/16 0931  . escitalopram (LEXAPRO) tablet 10 mg  10 mg Oral Daily Altamese DillingVaibhavkumar Vachhani, MD   10 mg at 03/03/16 0932  . famotidine (PEPCID) tablet 20 mg  20 mg Oral BID Houston SirenVivek J Sainani, MD   20 mg at 03/03/16 0932  . fentaNYL (SUBLIMAZE) injection 50 mcg  50 mcg Intravenous Once Erin FullingKurian Kasa, MD      . fentaNYL (SUBLIMAZE) injection 50 mcg  50 mcg Intravenous Q1H PRN Shane CrutchPradeep Ramachandran, MD   50 mcg at 02/27/16 1252  . haloperidol (HALDOL) tablet 5 mg  5 mg Oral BID Shane CrutchPradeep Ramachandran, MD   5  mg at 03/03/16 0933  . insulin aspart (novoLOG) injection 0-9 Units  0-9 Units Subcutaneous TID AC & HS Houston SirenVivek J Sainani, MD   1 Units at 03/02/16 2150  . levothyroxine (SYNTHROID, LEVOTHROID) tablet 25 mcg  25 mcg Oral Daymon [id number] Vaibhavkumar Vachhani, MD   25 mcg at 03/03/16 [phone]  . LORazepam (ATIVAN) injection 1 mg  1 mg Intravenous Q2H PRN Gwendolyn FillBincy S Varughese, NP   1 mg at 02/27/16 0826  . midazolam (VERSED) injection 2 mg  2 mg Intravenous Q15 min PRN Erin FullingKurian Kasa, MD      . midazolam (VERSED) injection 2 mg  2 mg Intravenous Q2H PRN Erin FullingKurian Kasa, MD   2 mg at 02/26/16 2251  . omega-3 acid ethyl esters (LOVAZA) capsule 1 g  1 g Oral Daily Altamese DillingVaibhavkumar Vachhani, MD   1 g at 03/03/16 0932  . ondansetron (ZOFRAN) injection 4 mg  4 mg Intravenous Q6H PRN Erin FullingKurian Kasa, MD      . risperiDONE (RISPERDAL) tablet 3 mg  3 mg Oral BID Altamese DillingVaibhavkumar Vachhani, MD   3 mg at 03/03/16 0933  . sodium chloride flush (NS) 0.9 % injection 3 mL  3 mL Intravenous Q12H Erin FullingKurian Kasa, MD   3 mL at 03/03/16 0934  . sodium chloride flush (NS) 0.9 % injection 3 mL  3 mL Intravenous PRN Erin FullingKurian Kasa, MD      . Vitamin D (Ergocalciferol) (DRISDOL) capsule 50,000 Units  50,000 Units Oral Q7 days Altamese DillingVaibhavkumar Vachhani, MD      . warfarin (COUMADIN) tablet 3 mg  3 mg Oral ONCE-1800 Houston SirenVivek J Sainani, MD      . Warfarin - Pharmacist Dosing Inpatient   Does not apply [id number] Shane CrutchPradeep Ramachandran, MD         Discharge Medications: Please see discharge summary for a list of discharge medications.  Relevant Imaging Results:  Relevant Lab Results:   Additional Information  (SSN [id number])  Verta Ellenhristina E Shaterria Sager, LCSW

## 2016-03-04 DIAGNOSIS — E440 Moderate protein-calorie malnutrition: Secondary | ICD-10-CM | POA: Insufficient documentation

## 2016-03-04 LAB — PROTIME-INR
INR: 2.58
Prothrombin Time: 28.2 seconds — ABNORMAL HIGH (ref 11.4–15.2)

## 2016-03-04 LAB — GLUCOSE, CAPILLARY
GLUCOSE-CAPILLARY: 79 mg/dL (ref 65–99)
Glucose-Capillary: 149 mg/dL — ABNORMAL HIGH (ref 65–99)

## 2016-03-04 LAB — TRIGLYCERIDES: Triglycerides: 53 mg/dL (ref <150)

## 2016-03-04 MED ORDER — WARFARIN SODIUM 5 MG PO TABS: 5.0000 mg | ORAL_TABLET | Freq: Once | ORAL | Status: AC

## 2016-03-04 MED ORDER — ALPRAZOLAM 1 MG PO TABS: 1.0000 mg | ORAL_TABLET | Freq: Two times a day (BID) | ORAL | 0 refills | Status: AC | PRN

## 2016-03-04 MED ORDER — AMOXICILLIN-POT CLAVULANATE 875-125 MG PO TABS: 1.0000 | ORAL_TABLET | Freq: Two times a day (BID) | ORAL | 0 refills | Status: AC

## 2016-03-04 MED ORDER — HALOPERIDOL 5 MG PO TABS: 2.5000 mg | ORAL_TABLET | Freq: Two times a day (BID) | ORAL | Status: AC

## 2016-03-04 MED ORDER — WARFARIN SODIUM 5 MG PO TABS: 5.0000 mg | ORAL_TABLET | Freq: Once | ORAL | Status: DC

## 2016-03-04 MED ORDER — ENOXAPARIN SODIUM 150 MG/ML ~~LOC~~ SOLN: 1.0000 mg/kg | Freq: Two times a day (BID) | SUBCUTANEOUS | Status: AC

## 2016-03-04 MED ORDER — ENSURE ENLIVE PO LIQD
237.0000 mL | Freq: Three times a day (TID) | ORAL | Status: DC
  Administered 2016-03-04: 237 mL via ORAL

## 2016-03-04 NOTE — Progress Notes (Signed)
SUBJECTIVE: No chest pain or shortness of breath   Vitals:   03/03/16 1237 03/03/16 1919 03/04/16 0413 03/04/16 0416  BP: (!) 135/48 (!) 119/47  (!) 124/47  Pulse: (!) 57 (!) 51  (!) 58  Resp: 19 18  18   Temp: 97.3 F (36.3 C) 98.6 F (37 C)  98.6 F (37 C)  TempSrc:      SpO2: 96% 97%  96%  Weight:   148 lb 3.2 oz (67.2 kg)   Height:        Intake/Output Summary (Last 24 hours) at 03/04/16 [id number] Last data filed at 03/03/16 1948  Gross per 24 hour  Intake                0 ml  Output                0 ml  Net                0 ml    LABS: Basic Metabolic Panel: No results for input(s): NA, K, CL, CO2, GLUCOSE, BUN, CREATININE, CALCIUM, MG, PHOS in the last 72 hours. Liver Function Tests: No results for input(s): AST, ALT, ALKPHOS, BILITOT, PROT, ALBUMIN in the last 72 hours. No results for input(s): LIPASE, AMYLASE in the last 72 hours. CBC:  Recent Labs  03/02/16 0422  WBC 8.5  HGB 10.7*  HCT 30.2*  MCV 97.1  PLT 127*   Cardiac Enzymes: No results for input(s): CKTOTAL, CKMB, CKMBINDEX, TROPONINI in the last 72 hours. BNP: Invalid input(s): POCBNP D-Dimer: No results for input(s): DDIMER in the last 72 hours. Hemoglobin A1C: No results for input(s): HGBA1C in the last 72 hours. Fasting Lipid Panel:  Recent Labs  03/04/16 0151  TRIG 53   Thyroid Function Tests: No results for input(s): TSH, T4TOTAL, T3FREE, THYROIDAB in the last 72 hours.  Invalid input(s): FREET3 Anemia Panel: No results for input(s): VITAMINB12, FOLATE, FERRITIN, TIBC, IRON, RETICCTPCT in the last 72 hours.   PHYSICAL EXAM General: Well developed, well nourished, in no acute distress HEENT:  Normocephalic and atramatic Neck:  No JVD.  Lungs: Clear bilaterally to auscultation and percussion. Heart: HRRR . Normal S1 and S2 without gallops or murmurs.  Abdomen: Bowel sounds are positive, abdomen soft and non-tender  Msk:  Back normal, normal gait. Normal strength and tone for  age. Extremities: No clubbing, cyanosis or edema.   Neuro: Alert and oriented X 3. Psych:  Good affect, responds appropriately  TELEMETRY:Sinus rhythm  ASSESSMENT AND PLAN: LV apical thrombus status post anticoagulation with heparin and Coumadin with INR therapeutic. Patient can be discharged with follow-up in the office next week.  Principal Problem:   Altered mental status Active Problems:   Respiratory failure (HCC)   Malnutrition of moderate degree    KHAN,Warren A, MD, [facility] Center[facility]ACC 03/04/2016 9:22 AM

## 2016-03-04 NOTE — Clinical Social Work Placement (Signed)
   CLINICAL SOCIAL WORK PLACEMENT  NOTE  Date:  03/04/2016  Patient Details  Name: Warren NeighborRalph M Lape Jr. MRN: [id number] Date of Birth: 04/04/1951  Clinical Social Work is seeking post-discharge placement for this patient at the Skilled  Nursing Facility level of care (*CSW will initial, date and re-position this form in  chart as items are completed):  Yes   Patient/family provided with [place] Clinical Social Work Department's list of facilities offering this level of care within the geographic area requested by the patient (or if unable, by the patient's family).  Yes   Patient/family informed of their freedom to choose among providers that offer the needed level of care, that participate in Medicare, Medicaid or managed care program needed by the patient, have an available bed and are willing to accept the patient.  Yes   Patient/family informed of [facility] ownership interest in [facility] and [facility], as well as of the fact that they are under no obligation to receive care at these facilities.  PASRR submitted to EDS on 03/04/16     PASRR number received on 03/04/16     Existing PASRR number confirmed on       FL2 transmitted to all facilities in geographic area requested by pt/family on 03/04/16     FL2 transmitted to all facilities within larger geographic area on       Patient informed that his/her managed care company has contracts with or will negotiate with certain facilities, including the following:        Yes   Patient/family informed of bed offers received.  Patient [facility] bed at  [facility]     Physician recommends and patient chooses bed at      Patient to be transferred to  [facility] on 03/04/16.  Patient to be transferred to facility by  (EMS)     Patient family notified on 03/04/16 of transfer.  Name of family member notified:   Ethelene Browns(Anthony)     PHYSICIAN       Additional Comment:     _______________________________________________ Idamae Lusherhristina E Nelta Caudill, LCSW 03/04/2016, 3:23 PM

## 2016-03-04 NOTE — Progress Notes (Signed)
CSW presented bed offers to [name]. [facility]. CSW left voicemail for [name] Admissions Coordinator [facility].  Woodroe Modehristina Clorine Swing, MSW, LCSW, LCAS-A Clinical Social Worker [phone]

## 2016-03-04 NOTE — Discharge Summary (Signed)
[facility] [place] [facility]   PATIENT NAME: Warren [name]    MR#:  [id number]  DATE OF BIRTH:  March 08, 1951  DATE OF ADMISSION:  02/26/2016 ADMITTING PHYSICIAN: Altamese Dilling, MD  DATE OF DISCHARGE: 03/04/2016  PRIMARY CARE PHYSICIAN: Evelene Croon, MD    ADMISSION DIAGNOSIS:  CVA (cerebral infarction) [I63.9] Generalized weakness [R53.1] Altered mental status, unspecified altered mental status type [R41.82]  DISCHARGE DIAGNOSIS:  Principal Problem:   Altered mental status Active Problems:   Respiratory failure (HCC)   Malnutrition of moderate degree   SECONDARY DIAGNOSIS:   Past Medical History:  Diagnosis Date  . Anxiety   . Bipolar 1 disorder (HCC)   . Bone disorder   . Depression   . GERD (gastroesophageal reflux disease)     HOSPITAL COURSE:   65 year old male with past medical history of dementia, bipolar disorder, depression, GERD/anxiety who presented to the hospital due to altered mental status.  1. AMS - this was due to advanced Dementia. CVA/Seizures has been ruled out.  - CT head X 2 and MRI Brain (-) for any acute pathology.   - mental status back to baseline.  Cont. Namenda/Aricept.   2. Status post cardiac arrest- the etiology of cardiac arrest unclear.  -Cardiology following the patient and patient noted to have a mural thrombus on echocardiogram - now on Lovenox & Coumadin and INR therapeutic.  Cont. Lovenox for a few more days (3 days) and then d/c and cont. Coumadin.  - follow up with Cardiology in a few weeks. Goal INR is between 2.5-3.5.    3. Mural Thrombus - cont. Lovenox, coumadin and INR 2.5 today.  - cont. Lovenox for 3 more days. Cont. Coumadin for goal INR of 2.5-3.5   4. DM -  Was on SSI while in the hospital and BS have remained stable.   5. Dementia w/ behavioral disturbanc - cont. Aricept/Namenda - cont. Haldol, Risperdal for agitation.   6. Bipolar Disorder - cont. Risperdal  7. Acute  REsp. Failure w/ Hypoxia - pt. Was intubated due to cardiac arrest. - extubated 3-4 days ago and clinically doing well on minimal O2.  Was noted to have some Aspiration Pneumonitis and has improved.  - was on Zosyn and now on Oral Augmentin and can finish the course for 2 more days.  - now on Dysphagia 3 diet w/ thin liquids and tolerating it well.   D/C to SNF today.   DISCHARGE CONDITIONS:   Stable.   CONSULTS OBTAINED:  Treatment Team:  Thana Farr, MD Laurier Nancy, MD  DRUG ALLERGIES:  No Known Allergies  DISCHARGE MEDICATIONS:     Medication List    TAKE these medications   acetaminophen 500 MG tablet Commonly known as:  TYLENOL Take 500 mg by mouth 2 (two) times daily as needed.   alendronate 70 MG tablet Commonly known as:  FOSAMAX Take 70 mg by mouth once a week. Take with a full glass of water on an empty stomach.   ALPRAZolam 1 MG tablet Commonly known as:  XANAX Take 1 tablet (1 mg total) by mouth 2 (two) times daily as needed for anxiety.   amoxicillin-clavulanate 875-125 MG tablet Commonly known as:  AUGMENTIN Take 1 tablet by mouth 2 (two) times daily.   benztropine 1 MG tablet Commonly known as:  COGENTIN Take 1 mg by mouth at bedtime.   chlorproMAZINE 50 MG tablet Commonly known as:  THORAZINE Take 50 mg by mouth 2 (two) times  daily.   enoxaparin 150 MG/ML injection Commonly known as:  LOVENOX Inject 0.46 mLs (70 mg total) into the skin every 12 (twelve) hours.   escitalopram 10 MG tablet Commonly known as:  LEXAPRO Take 10 mg by mouth daily.   Fish Oil 1000 MG Cpdr Take 2 capsules by mouth 2 (two) times daily.   haloperidol 5 MG tablet Commonly known as:  HALDOL Take 0.5 tablets (2.5 mg total) by mouth 2 (two) times daily. 2nd dose at 1600 What changed:  how much to take   levothyroxine 25 MCG tablet Commonly known as:  SYNTHROID, LEVOTHROID Take 25 mcg by mouth every morning.   NAMZARIC 28-10 MG Cp24 Generic drug:   Memantine HCl-Donepezil HCl Take 1 tablet by mouth daily.   omeprazole 40 MG capsule Commonly known as:  PRILOSEC Take 40 mg by mouth daily.   risperiDONE 3 MG tablet Commonly known as:  RISPERDAL Take 3 mg by mouth 2 (two) times daily.   Vitamin D (Ergocalciferol) 50000 units Caps capsule Commonly known as:  DRISDOL Take 50,000 Units by mouth every 7 (seven) days.   warfarin 5 MG tablet Commonly known as:  COUMADIN Take 1 tablet (5 mg total) by mouth one time only at 6 PM.         DISCHARGE INSTRUCTIONS:   DIET:  Cardiac diet dysphagia III w/ thin liquids. STrict Aspiration Precautions  DISCHARGE CONDITION:  Stable  ACTIVITY:  Activity as tolerated  OXYGEN:  Home Oxygen: No.   Oxygen Delivery: room air  DISCHARGE LOCATION:  nursing home   If you experience worsening of your admission symptoms, develop shortness of breath, life threatening emergency, suicidal or homicidal thoughts you must seek medical attention immediately by calling 911 or calling your MD immediately  if symptoms less severe.  You Must read complete instructions/literature along with all the possible adverse reactions/side effects for all the Medicines you take and that have been prescribed to you. Take any new Medicines after you have completely understood and accpet all the possible adverse reactions/side effects.   Please note  You were cared for by a hospitalist during your hospital stay. If you have any questions about your discharge medications or the care you received while you were in the hospital after you are discharged, you can call the unit and asked to speak with the hospitalist on call if the hospitalist that took care of you is not available. Once you are discharged, your primary care physician will handle any further medical issues. Please note that NO REFILLS for any discharge medications will be authorized once you are discharged, as it is imperative that you return to your primary  care physician (or establish a relationship with a primary care physician if you do not have one) for your aftercare needs so that they can reassess your need for medications and monitor your lab values.     Today   No acute events overnight. MS stable. No seizures.  Tolerating PO well.   VITAL SIGNS:  Blood pressure (!) 106/48, pulse 70, temperature 97.5 F (36.4 C), temperature source Oral, resp. rate 20, height 6' (1.829 m), weight 67.2 kg (148 lb 3.2 oz), SpO2 99 %.  I/O:    Intake/Output Summary (Last 24 hours) at 03/04/16 1133 Last data filed at 03/04/16 1031  Gross per 24 hour  Intake                0 ml  Output  0 ml  Net                0 ml    PHYSICAL EXAMINATION:  GENERAL:  65 y.o.-year-old patient lying in the bed with no acute distress.  EYES: Pupils equal, round, reactive to light and accommodation. No scleral icterus. Extraocular muscles intact.  HEENT: Head atraumatic, normocephalic. Oropharynx and nasopharynx clear.  NECK:  Supple, no jugular venous distention. No thyroid enlargement, no tenderness.  LUNGS: Normal breath sounds bilaterally, no wheezing, rales,rhonchi. No use of accessory muscles of respiration.  CARDIOVASCULAR: S1, S2 normal. No murmurs, rubs, or gallops.  ABDOMEN: Soft, non-tender, non-distended. Bowel sounds present. No organomegaly or mass.  EXTREMITIES: No pedal edema, cyanosis, or clubbing.  NEUROLOGIC: Cranial nerves II through XII are intact. No focal motor or sensory defecits b/l. Globally weak.   PSYCHIATRIC: The patient is alert and oriented x 1. Good affect.  SKIN: No obvious rash, lesion, or ulcer.   DATA REVIEW:   CBC  Recent Labs Lab 03/02/16 0422  WBC 8.5  HGB 10.7*  HCT 30.2*  PLT 127*    Chemistries   Recent Labs Lab 02/28/16 0405 02/28/16 1702 02/29/16 0418  NA 139  --  145  K 3.1*  --  3.8  CL 105  --  113*  CO2 28  --  28  GLUCOSE 142*  --  107*  BUN 19  --  19  CREATININE 1.04  --  0.99   CALCIUM 8.3*  --  8.4*  MG 2.3 2.3  --   AST 23  --   --   ALT 16*  --   --   ALKPHOS 77  --   --   BILITOT 1.0  --   --     Cardiac Enzymes  Recent Labs Lab 02/28/16 0955  TROPONINI <0.03    Microbiology Results  Results for orders placed or performed during the hospital encounter of 02/26/16  Urine culture     Status: Abnormal   Collection Time: 02/26/16 12:28 PM  Result Value Ref Range Status   Specimen Description URINE, RANDOM  Final   Special Requests NONE  Final   Culture >=100,000 COLONIES/mL VIRIDANS STREPTOCOCCUS (A)  Final   Report Status 02/27/2016 FINAL  Final  MRSA PCR Screening     Status: None   Collection Time: 02/26/16  9:12 PM  Result Value Ref Range Status   MRSA by PCR NEGATIVE NEGATIVE Final    Comment:        The GeneXpert MRSA Assay (FDA approved for NASAL specimens only), is one component of a comprehensive MRSA colonization surveillance program. It is not intended to diagnose MRSA infection nor to guide or monitor treatment for MRSA infections.     RADIOLOGY:  Ct Head Wo Contrast  Result Date: 03/03/2016 CLINICAL DATA:  65 year old male with history of altered mental status. EXAM: CT HEAD WITHOUT CONTRAST TECHNIQUE: Contiguous axial images were obtained from the base of the skull through the vertex without intravenous contrast. COMPARISON:  Head CT 02/26/2016. FINDINGS: Brain: Mild cerebral atrophy with ex vacuo dilatation of the ventricular system. Patchy and confluent areas of decreased attenuation are noted throughout the deep and periventricular white matter of the cerebral hemispheres bilaterally, compatible with chronic microvascular ischemic disease. No acute intracranial abnormalities. Specifically, no evidence of acute intracranial hemorrhage, no definite findings of acute/subacute cerebral ischemia, no mass, mass effect, hydrocephalus or abnormal intra or extra-axial fluid collections. Vascular: No hyperdense vessel or unexpected  calcification. Skull: Normal. Negative for fracture or focal lesion. Sinuses/Orbits: No acute finding. Other: None. IMPRESSION: 1. No acute intracranial abnormalities. The appearance of the brain is unchanged, as above. Electronically Signed   By: Trudie Reed M.D.   On: 03/03/2016 12:13      Management plans discussed with the patient, family and they are in agreement.  CODE STATUS:     Code Status Orders        Start     Ordered   02/26/16 2052  Full code  Continuous     02/26/16 2054    Code Status History    Date Active Date Inactive Code Status Order ID Comments User Context   02/26/2016  4:45 PM 02/26/2016  6:10 PM Full Code [id number]  Altamese Dilling, MD Inpatient      TOTAL TIME TAKING CARE OF THIS PATIENT: 40 minutes.    Houston Siren M.D on 03/04/2016 at 11:33 AM  Between 7am to 6pm - Pager - [phone]  After 6pm go to [email or web address] - Social research officer, government  [facility] [place] [facility]  Office  [phone]  CC: Primary care physician; Evelene Croon, MD

## 2016-03-04 NOTE — Discharge Instructions (Signed)
Confusion Confusion is the inability to think with your usual speed or clarity. Confusion may come on quickly or slowly over time. How quickly the confusion comes on depends on the cause. Confusion can be due to any number of causes. CAUSES   Concussion, head injury, or head trauma.  Seizures.  Stroke.  Fever.  Brain tumor.  Age related decreased brain function (dementia).  Heightened emotional states like rage or terror.  Mental illness in which the person loses the ability to determine what is real and what is not (hallucinations).  Infections such as a urinary tract infection (UTI).  Toxic effects from alcohol, drugs, or prescription medicines.  Dehydration and an imbalance of salts in the body (electrolytes).  Lack of sleep.  Low blood sugar (diabetes).  Low levels of oxygen from conditions such as chronic lung disorders.  Drug interactions or other medicine side effects.  Nutritional deficiencies, especially niacin, thiamine, vitamin C, or vitamin B.  Sudden drop in body temperature (hypothermia).  Change in routine, such as when traveling or hospitalized. SIGNS AND SYMPTOMS  People often describe their thinking as cloudy or unclear when they are confused. Confusion can also include feeling disoriented. That means you are unaware of where or who you are. You may also not know what the date or time is. If confused, you may also have difficulty paying attention, remembering, and making decisions. Some people also act aggressively when they are confused.  DIAGNOSIS  The medical evaluation of confusion may include:  Blood and urine tests.  X-rays.  Brain and nervous system tests.  Analyzing your brain waves (electroencephalogram or EEG).  Magnetic resonance imaging (MRI) of your head.  Computed tomography (CT) scan of your head.  Mental status tests in which your health care provider may ask many questions. Some of these questions may seem silly or strange,  but they are a very important test to help diagnose and treat confusion. TREATMENT  An admission to the hospital may not be needed, but a person with confusion should not be left alone. Stay with a family member or friend until the confusion clears. Avoid alcohol, pain relievers, or sedative drugs until you have fully recovered. Do not drive until directed by your health care provider. HOME CARE INSTRUCTIONS  What family and friends can do:  To find out if someone is confused, ask the person to state his or her name, age, and the date. If the person is unsure or answers incorrectly, he or she is confused.  Always introduce yourself, no matter how well the person knows you.  Often remind the person of his or her location.  Place a calendar and clock near the confused person.  Help the person with his or her medicines. You may want to use a pill box, an alarm as a reminder, or give the person each dose as prescribed.  Talk about current events and plans for the day.  Try to keep the environment calm, quiet, and peaceful.  Make sure the person keeps follow-up visits with his or her health care provider. PREVENTION  Ways to prevent confusion:  Avoid alcohol.  Eat a balanced diet.  Get enough sleep.  Take medicine only as directed by your health care provider.  Do not become isolated. Spend time with other people and make plans for your days.  Keep careful watch on your blood sugar levels if you are diabetic. SEEK IMMEDIATE MEDICAL CARE IF:   You develop severe headaches, repeated vomiting, seizures, blackouts, or   slurred speech.  There is increasing confusion, weakness, numbness, restlessness, or personality changes.  You develop a loss of balance, have marked dizziness, feel uncoordinated, or fall.  You have delusions, hallucinations, or develop severe anxiety.  Your family members think you need to be rechecked.   This information is not intended to replace advice given  to you by your health care provider. Make sure you discuss any questions you have with your health care provider.   Document Released: 07/15/2004 Document Revised: 06/28/2014 Document Reviewed: 07/13/2013 Elsevier Interactive Patient Education 2016 Elsevier Inc.  

## 2016-03-04 NOTE — Progress Notes (Signed)
Called nurse-to-nurse report to receiving facility at this time. All questions answered, pertinent info discussed. Jari FavreSteven M Ann Klein Forensic Centermhoff

## 2016-03-04 NOTE — Progress Notes (Signed)
ANTICOAGULATION CONSULT NOTE -Follow up Consult  Pharmacy Consult for Heparin/Warfarin Indication: Left ventricular thrombus  No Known Allergies  Patient Measurements: Height: 6' (182.9 cm) Weight: 148 lb 3.2 oz (67.2 kg) IBW/kg (Calculated) : 77.6 Heparin Dosing Weight: 65.5 kg   Vital Signs: Temp: 98.6 F (37 C) (09/14 0416) BP: 124/47 (09/14 0416) Pulse Rate: 58 (09/14 0416)  Labs:  Recent Labs  03/02/16 0422 03/03/16 0400 03/04/16 0151  HGB 10.7*  --   --   HCT 30.2*  --   --   PLT 127*  --   --   LABPROT 17.1* 25.1* 28.2*  INR 1.38 2.23 2.58  HEPARINUNFRC 0.19*  --   --     Estimated Creatinine Clearance: 70.7 mL/min (by C-G formula based on SCr of 0.99 mg/dL).   Assessment: Pharmacy consulted to dose heparin/warfarin in this 65 year old male with left ventricular thrombus. Patient on no anticoagulant medication at group home.  9/8   INR: 1.12 9/9   INR:  No INR   No Warfarin given 9/10 INR: 1.10     Warfarin 5 mg 9/11 INR: 1.12     Warfarin 7.5 mg 9/12 INR: 1.38     Warfarin 7.5mg  9/13 INR: 2.23     Warfarin 3mg  9/14 INR: 2.58        Goal of Therapy:  INR 2.5-3 Monitor platelets by anticoagulation protocol: Yes   Plan:  INR remains therapeutic, will give warfarin 5mg  x 1 this evening. Continue enoxaparin  Plan to continue enoxaparin until day 5 of warfarin therapy with a therapeutic INR x 48 hours.   Recheck INR with AM labs  Artemisa Sladek C, Pharm.D Clinical Pharmacist 03/04/2016,11:11 AM

## 2016-03-04 NOTE — Progress Notes (Signed)
Clinical Social Worker was informed that [facility] by Gavin Poundeborah- [employer]missions Coordinator. Informed that patient will be medically [facility]. Patient and [name] are in a agreement with plan. CSW called Gavin Poundeborah- [facility] to confirm that patient's bed is ready. Provided patient's room number 304 and number to call for report [phone] . [facility] via [employer]. RN will call report and [facility]  Woodroe Modehristina Zera Markwardt, MSW, [place]CSW, LCAS-A Clinical Social Worker [phone]

## 2016-03-04 NOTE — Progress Notes (Signed)
CSW spoke to Sun MicrosystemsDeborah- Scientist, research (physical sciences)Admissions Coordinator [facility]. Reports that she's no able to accept patient until [name] comes to complete patient's paperwork. Attempted to call [name]. No answer and unable to leave voicemail.  Woodroe Modehristina Gumecindo Hopkin, MSW, LCSW, LCAS-A Clinical Social Worker [phone]

## 2016-03-04 NOTE — Progress Notes (Signed)
Speech Language Pathology Treatment: Dysphagia  Patient Details Name: Warren NeighborRalph M Nocera Jr. MRN: [id number] DOB: 07/01/1950 Today's Date: 03/04/2016 Time: [id number] SLP Time Calculation (min) (ACUTE ONLY): 45 min  Assessment / Plan / Recommendation Clinical Impression  Pt appeared to adequately tolerate trials of thin liquids via straw(helping to hold cup and carton when drinking) w/ purees and softened solids - no immediate, overt s/s of aspiration noted during meal. No decline in vocal quality or decline in respiratory status noted during/post meal. Pt consumed ~75% of the meal. During the Oral phase, pt exhibited increased oral phase time w/ increased textured foods but what was most noted was the disorganized bolus management. Pt is also edentulous. He often yelled out d/t his declined Cognitive status.  Pt would benefit from a modified food consistency diet of a Dysphagia level 2 w/ thin liquids - MONITORING w/ ALL oral intake; aspiration precautions; Meds in PUREE - CRUSHED as able for easier swallowing. Education was given to NSG staff present on support and facilitation for his diet and for his swallowing during intake including the delivery of medications. NSG agreed. ST services will be available if any further needs while admitted. NSG agreed. CM updated.       HPI: Warren [name]  is a 65 y.o. male with a known history of Anxiety, bipolar disorder, depression, gastric surgery for disease- lives in a group home for last 17 years without any visits from family. The owner of the group home is his primary caretaker and decision maker. He was sent to emergency room by his caretaker because he is not himself today. Normally he is able to walk without any support he is very talkative, today he was noted confused and not talking and just keep staring. He also only walk one to 2 steps this morning and remains seated in his chair which is very unusual for him. The Patient was talking to the nurse while he was  eating his sandwich and the RN was hanging famotidine IV.  Patient was not responding to the nurse's conversation. The RN looked at the patient and realized the patient was rolling his eyes and could not talk. Rapid respond called because the patient had a pulse. The rapid was cancelled and code blue called as the patient became unresponsive with thready pulse. CPR was performed while we realized the patient was choking.  Patient started vomiting the choking materials (bread) and later became responsive and talkative. Patient back to his baseline after the choking material came off. Staff noted pt lethargic yesterday during mealtimes. ST to f/u w/ tolreation of any oral diet; education.       SLP Plan  Continue with current plan of care     Recommendations  Diet recommendations: Dysphagia 2 (fine chop);Thin liquid Liquids provided via: Cup;Straw Medication Administration: Crushed with puree Supervision: Patient able to self feed;Staff to assist with self feeding;Full supervision/cueing for compensatory strategies (pt can hold cup when drinking) Compensations: Minimize environmental distractions;Slow rate;Small sips/bites;Lingual sweep for clearance of pocketing;Follow solids with liquid Postural Changes and/or Swallow Maneuvers: Seated upright 90 degrees;Upright 30-60 min after meal             General recommendations:  (Dietician f/u) Oral Care Recommendations: Oral care BID;Staff/trained caregiver to provide oral care Follow up Recommendations: 24 hour supervision/assistance (w/ meals) Plan: Continue with current plan of care     GO                Jerilynn SomKatherine Rmani Kellogg, MS, CCC-SLP  Mekia Dipinto 03/04/2016, 1:41 PM

## 2016-03-04 NOTE — Progress Notes (Signed)
Called EMS for non-emergent patient transport to a facility at this time. Warren FavreSteven M [name]

## 2016-03-10 ENCOUNTER — Other Ambulatory Visit: Payer: Self-pay | Admitting: Family Medicine

## 2016-03-10 DIAGNOSIS — R1310 Dysphagia, unspecified: Secondary | ICD-10-CM

## 2016-03-11 ENCOUNTER — Other Ambulatory Visit
Admission: RE | Admit: 2016-03-11 | Discharge: 2016-03-11 | Disposition: A | Discharge status: Home / Self Care | Payer: Medicare Other | Source: Ambulatory Visit | Attending: Family Medicine | Admitting: Family Medicine

## 2016-03-11 DIAGNOSIS — R509 Fever, unspecified: Secondary | ICD-10-CM | POA: Diagnosis present

## 2016-03-11 LAB — CBC WITH DIFFERENTIAL/PLATELET
BASOS ABS: 0.1 10*3/uL (ref 0–0.1)
BASOS PCT: 1 %
EOS PCT: 3 %
Eosinophils Absolute: 0.3 10*3/uL (ref 0–0.7)
HCT: 26.7 % — ABNORMAL LOW (ref 40.0–52.0)
Hemoglobin: 9.3 g/dL — ABNORMAL LOW (ref 13.0–18.0)
Lymphocytes Relative: 22 %
Lymphs Abs: 2.4 10*3/uL (ref 1.0–3.6)
MCH: 34.8 pg — ABNORMAL HIGH (ref 26.0–34.0)
MCHC: 34.8 g/dL (ref 32.0–36.0)
MCV: 99.8 fL (ref 80.0–100.0)
MONO ABS: 0.7 10*3/uL (ref 0.2–1.0)
Monocytes Relative: 7 %
Neutro Abs: 7.4 10*3/uL — ABNORMAL HIGH (ref 1.4–6.5)
Neutrophils Relative %: 67 %
PLATELETS: 195 10*3/uL (ref 150–440)
RBC: 2.68 MIL/uL — ABNORMAL LOW (ref 4.40–5.90)
RDW: 13.8 % (ref 11.5–14.5)
WBC: 10.9 10*3/uL — ABNORMAL HIGH (ref 3.8–10.6)

## 2016-03-24 ENCOUNTER — Ambulatory Visit: Payer: Medicare Other

## 2016-05-06 ENCOUNTER — Emergency Department
Admission: EM | Admit: 2016-05-06 | Discharge: 2016-05-06 | Disposition: A | Discharge status: Home / Self Care | Payer: Medicare Other | Attending: Emergency Medicine | Admitting: Emergency Medicine

## 2016-05-06 ENCOUNTER — Emergency Department: Payer: Medicare Other

## 2016-05-06 ENCOUNTER — Encounter: Payer: Self-pay | Admitting: Emergency Medicine

## 2016-05-06 DIAGNOSIS — Y999 Unspecified external cause status: Secondary | ICD-10-CM | POA: Diagnosis not present

## 2016-05-06 DIAGNOSIS — W06XXXA Fall from bed, initial encounter: Secondary | ICD-10-CM | POA: Insufficient documentation

## 2016-05-06 DIAGNOSIS — S0990XA Unspecified injury of head, initial encounter: Secondary | ICD-10-CM | POA: Diagnosis present

## 2016-05-06 DIAGNOSIS — Z7901 Long term (current) use of anticoagulants: Secondary | ICD-10-CM | POA: Insufficient documentation

## 2016-05-06 DIAGNOSIS — Z79899 Other long term (current) drug therapy: Secondary | ICD-10-CM | POA: Diagnosis not present

## 2016-05-06 DIAGNOSIS — Y9289 Other specified places as the place of occurrence of the external cause: Secondary | ICD-10-CM | POA: Diagnosis not present

## 2016-05-06 DIAGNOSIS — S0121XA Laceration without foreign body of nose, initial encounter: Secondary | ICD-10-CM | POA: Insufficient documentation

## 2016-05-06 DIAGNOSIS — S40011A Contusion of right shoulder, initial encounter: Secondary | ICD-10-CM | POA: Diagnosis not present

## 2016-05-06 DIAGNOSIS — Y9389 Activity, other specified: Secondary | ICD-10-CM | POA: Diagnosis not present

## 2016-05-06 DIAGNOSIS — W19XXXA Unspecified fall, initial encounter: Secondary | ICD-10-CM

## 2016-05-06 DIAGNOSIS — S0181XA Laceration without foreign body of other part of head, initial encounter: Secondary | ICD-10-CM

## 2016-05-06 NOTE — ED Triage Notes (Signed)
Pt from [facility] - found on the floor - per pt he rolled out of bed. Small lac under nose

## 2016-05-06 NOTE — ED Provider Notes (Signed)
[facility] Emergency Department Provider Note     L5 caveat: Review of systems and history is limited by chronic altered mental status   Time seen: ----------------------------------------- 11:14 AM on 05/06/2016 -----------------------------------------    I have reviewed the triage vital signs and the nursing notes.   HISTORY  Chief Complaint Fall    HPI Warren [name] is a 65 y.o. male who [facility]. Patient was found on the floor, states he rolled out of bed. Patient then reported that he possibly lost consciousness. He was noted to have a bleeding laceration under the right side of his nose. He denies any other recent illness or complaints. Patient presents with contusions in various stages of healing on his body.   Past Medical History:  Diagnosis Date  . Anxiety   . Bipolar 1 disorder (HCC)   . Bone disorder   . Depression   . GERD (gastroesophageal reflux disease)     Patient Active Problem List   Diagnosis Date Noted  . Malnutrition of moderate degree 03/04/2016  . Altered mental status 02/26/2016  . Respiratory failure (HCC) 02/26/2016    No past surgical history on file.  Allergies Patient has no known allergies.  Social History Social History  Substance Use Topics  . Smoking status: Never Smoker  . Smokeless tobacco: Never Used  . Alcohol use No    Review of Systems Unknown, positive for contusions, possible loss of consciousness  ____________________________________________   PHYSICAL EXAM:  VITAL SIGNS: ED Triage Vitals  Enc Vitals Group     BP      Pulse      Resp      Temp      Temp src      SpO2      Weight      Height      Head Circumference      Peak Flow      Pain Score      Pain Loc      Pain Edu?      Excl. in GC?     Constitutional: Alert but disoriented, no distress Eyes: Conjunctivae are normal. PERRL. Normal extraocular movements. ENT   Head:  Normocephalic   Nose: No congestion/rhinnorhea. There is a V-shaped 2 cm laceration beneath the right nasal passage   Mouth/Throat: Mucous membranes are moist.   Neck: No stridor. Cardiovascular: Normal rate, regular rhythm. No murmurs, rubs, or gallops. Respiratory: Normal respiratory effort without tachypnea nor retractions. Breath sounds are clear and equal bilaterally. No wheezes/rales/rhonchi. Gastrointestinal: Soft and nontender. Normal bowel sounds Musculoskeletal: Nontender with limited but normal range of motion in all extremities.  Neurologic:  Normal speech and language. No gross focal neurologic deficits are appreciated.  Skin:  Scattered contusions over the extremities, particularly over the right shoulder Psychiatric: Mood and affect are normal.  ____________________________________________  ED COURSE:  Pertinent labs & imaging results that were available during my care of the patient were reviewed by me and considered in my medical decision making (see chart for details). Clinical Course   Patient's no distress, we will assess with labs, provide laceration repair  .Marland Kitchen.Laceration Repair Date/Time: 05/06/2016 11:59 AM Performed by: Emily FilbertWILLIAMS, JONATHAN E Authorized by: Daryel NovemberWILLIAMS, JONATHAN E   Consent:    Consent obtained:  Verbal   Consent given by:  Patient Anesthesia (see MAR for exact dosages):    Anesthesia method:  None Laceration details:    Location:  Face  Face location:  Nose   Length (cm):  2   Depth (mm):  3 Repair type:    Repair type:  Simple Exploration:    Contaminated: no   Treatment:    Area cleansed with:  Saline   Amount of cleaning:  Standard Skin repair:    Repair method:  Tissue adhesive Approximation:    Approximation:  Close   Vermilion border: well-aligned   Post-procedure details:    Dressing:  Open (no dressing)   Patient tolerance of procedure:  Tolerated well, no immediate  complications   ____________________________________________   RADIOLOGY Images were viewed by me  CT head, right shoulder x-rays IMPRESSION: 1. No fracture or acute malalignment in the right shoulder. 2. 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 Stable chronic sheet like calcifications in the right glenohumeral joint. 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 CT head is unremarkable ____________________________________________  FINAL ASSESSMENT AND PLAN  Fall, minor head injury, facial laceration  Plan: Patient with labs and imaging as dictated above. Patient is currently awake, alert and in no distress. He is eating lunch without complaints. X-rays here were normal. He is stable for outpatient follow-up.   Emily FilbertWilliams, Jonathan E, MD   Note: This dictation was prepared with Dragon dictation. Any transcriptional errors that result from this process are unintentional    Emily FilbertJonathan E Williams, MD 05/06/16 1314

## 2016-05-06 NOTE — ED Notes (Signed)
[facility] called to transport pt back to facility

## 2016-05-06 NOTE — ED Notes (Signed)
Paper work sent with [facility] to [facility]

## 2016-06-18 ENCOUNTER — Emergency Department
Admission: EM | Admit: 2016-06-18 | Discharge: 2016-06-18 | Disposition: A | Discharge status: Home / Self Care | Payer: Medicare Other | Attending: Emergency Medicine | Admitting: Emergency Medicine

## 2016-06-18 ENCOUNTER — Emergency Department: Payer: Medicare Other

## 2016-06-18 DIAGNOSIS — Z7901 Long term (current) use of anticoagulants: Secondary | ICD-10-CM | POA: Insufficient documentation

## 2016-06-18 DIAGNOSIS — R451 Restlessness and agitation: Secondary | ICD-10-CM

## 2016-06-18 DIAGNOSIS — Z79899 Other long term (current) drug therapy: Secondary | ICD-10-CM | POA: Insufficient documentation

## 2016-06-18 DIAGNOSIS — R4182 Altered mental status, unspecified: Secondary | ICD-10-CM | POA: Diagnosis present

## 2016-06-18 LAB — COMPREHENSIVE METABOLIC PANEL
ALBUMIN: 3.7 g/dL (ref 3.5–5.0)
ALT: 21 U/L (ref 17–63)
ANION GAP: 8 (ref 5–15)
AST: 27 U/L (ref 15–41)
Alkaline Phosphatase: 97 U/L (ref 38–126)
BILIRUBIN TOTAL: 0.4 mg/dL (ref 0.3–1.2)
BUN: 28 mg/dL — ABNORMAL HIGH (ref 6–20)
CHLORIDE: 105 mmol/L (ref 101–111)
CO2: 26 mmol/L (ref 22–32)
Calcium: 9.1 mg/dL (ref 8.9–10.3)
Creatinine, Ser: 0.97 mg/dL (ref 0.61–1.24)
GFR calc Af Amer: >60 mL/min (ref >60)
GFR calc non Af Amer: >60 mL/min (ref >60)
Glucose, Bld: 111 mg/dL — ABNORMAL HIGH (ref 65–99)
POTASSIUM: 4 mmol/L (ref 3.5–5.1)
Sodium: 139 mmol/L (ref 135–145)
TOTAL PROTEIN: 7.1 g/dL (ref 6.5–8.1)

## 2016-06-18 LAB — CBC WITH DIFFERENTIAL/PLATELET
BASOS PCT: 0 %
Basophils Absolute: 0 10*3/uL (ref 0–0.1)
EOS ABS: 0.1 10*3/uL (ref 0–0.7)
EOS PCT: 0 %
HEMATOCRIT: 38 % — AB (ref 40.0–52.0)
Hemoglobin: 12.8 g/dL — ABNORMAL LOW (ref 13.0–18.0)
Lymphocytes Relative: 12 %
Lymphs Abs: 1.4 10*3/uL (ref 1.0–3.6)
MCH: 32.7 pg (ref 26.0–34.0)
MCHC: 33.8 g/dL (ref 32.0–36.0)
MCV: 96.8 fL (ref 80.0–100.0)
MONO ABS: 0.7 10*3/uL (ref 0.2–1.0)
MONOS PCT: 6 %
NEUTROS ABS: 9.7 10*3/uL — AB (ref 1.4–6.5)
Neutrophils Relative %: 82 %
PLATELETS: 195 10*3/uL (ref 150–440)
RBC: 3.92 MIL/uL — ABNORMAL LOW (ref 4.40–5.90)
RDW: 12.7 % (ref 11.5–14.5)
WBC: 12 10*3/uL — ABNORMAL HIGH (ref 3.8–10.6)

## 2016-06-18 LAB — PROTIME-INR
INR: 2.24
Prothrombin Time: 25.2 seconds — ABNORMAL HIGH (ref 11.4–15.2)

## 2016-06-18 MED ORDER — HALOPERIDOL 5 MG PO TABS
5.0000 mg | ORAL_TABLET | Freq: Once | ORAL | Status: AC
  Administered 2016-06-18: 5 mg via ORAL
  Filled 2016-06-18: qty 1

## 2016-06-18 NOTE — Discharge Instructions (Signed)
Patient was given the Haldol by mouth here in emergency department and has been cooperative. Review of his records shows no change in his baseline mental status. Head CT and laboratory work here within normal limits. No current in-house psychiatric evaluation is available.

## 2016-06-18 NOTE — ED Notes (Signed)
EMS called to transport pt home. Pt in NAD at this time.

## 2016-06-18 NOTE — ED Provider Notes (Signed)
Time Seen: Approximately *1335  I have reviewed the triage notes  Chief Complaint: Altered Mental Status   History of Present Illness: Warren [name] is a 65 y.o. male who [facility]. Patient apparently was lashing out at staff and not acting himself. View of his past records shows history of anxiety, depression, bipolar disorder. He has a history of agitation and altered mental status and is currently written for Risperdal and Haldol as needed. I cannot tell by the record on whether or not he received any of his Haldol. The patient here is cooperative and denies any physical complaints. Denies any headaches, nausea, vomiting, weakness.   Past Medical History:  Diagnosis Date  . Anxiety   . Bipolar 1 disorder (HCC)   . Bone disorder   . Depression   . GERD (gastroesophageal reflux disease)     Patient Active Problem List   Diagnosis Date Noted  . Malnutrition of moderate degree 03/04/2016  . Altered mental status 02/26/2016  . Respiratory failure (HCC) 02/26/2016    History reviewed. No pertinent surgical history.  History reviewed. No pertinent surgical history.  Current Outpatient Rx  . Order #: [id number] Class: Historical Med  . Order #: [id number] Class: Historical Med  . Order #: [id number] Class: Historical Med  . Order #: [id number] Class: Historical Med  . Order #: [id number] Class: Historical Med  . Order #: [id number] Class: Historical Med  . Order #: [id number] Class: No Print  . Order #: [id number] Class: Historical Med  . Order #: [id number] Class: Historical Med  . Order #: [id number] Class: Historical Med  . Order #: [id number] Class: Historical Med  . Order #: [id number] Class: Historical Med  . Order #: [id number] Class: Historical Med  . Order #: [id number] Class: Historical Med  . Order #: [id number] Class: Historical Med  . Order #: [id number] Class: No Print  . Order #: [id number] Class: Print  . Order #: [id number] Class: No  Print    Allergies:  Patient has no known allergies.  Family History: Family History  Problem Relation Age of Onset  . Family history unknown: Yes    Social History: Social History  Substance Use Topics  . Smoking status: Never Smoker  . Smokeless tobacco: Never Used  . Alcohol use No     Review of Systems:   10 point review of systems was performed and was otherwise negative:  Constitutional: No fever Eyes: No visual disturbances ENT: No sore throat, ear pain Cardiac: No chest pain Respiratory: No shortness of breath, wheezing, or stridor Abdomen: Mild abdominal discomfort at the site of his left lower quadrant of the abdomen. No nausea vomiting or diarrhea Endocrine: No weight loss, No night sweats Extremities: No peripheral edema, cyanosis Skin: No rashes, easy bruising Neurologic: No focal weakness, trouble with speech or swollowing Urologic: No dysuria, Hematuria, or urinary frequency *Patient denies any suicidal thoughts, homicidal thoughts, hallucinations.  Physical Exam:  ED Triage Vitals  Enc Vitals Group     BP      Pulse      Resp      Temp      Temp src      SpO2      Weight      Height      Head Circumference      Peak Flow      Pain Score      Pain Loc      Pain Edu?      Excl. in GC?     General: Awake ,  Alert , and Oriented times 2. Glasgow Coma Scale is 15. Patient with blow yellow loudly periodically but overall is been cooperative here in emergency department. Head: Normal cephalic , atraumatic Eyes: Pupils equal , round, reactive to light Nose/Throat: No nasal drainage, patent upper airway without erythema or exudate.  Neck: Supple, Full range of motion, No anterior adenopathy or palpable thyroid masses Lungs: Clear to ascultation without wheezes , rhonchi, or rales Heart: Regular rate, regular rhythm without murmurs , gallops , or rubs Abdomen: Soft, non tender without rebound, guarding , or rigidity; bowel sounds positive and  symmetric in all 4 quadrants. No organomegaly .        Extremities: 2 plus symmetric pulses. No edema, clubbing or cyanosis Neurologic: normal ambulation, Motor symmetric without deficits, sensory intact Skin: warm, dry, no rashes   Labs:   All laboratory work was reviewed including any pertinent negatives or positives listed below:  Labs Reviewed  CBC WITH DIFFERENTIAL/PLATELET  COMPREHENSIVE METABOLIC PANEL  PROTIME-INR      Radiology:  "Ct Head Wo Contrast  Result Date: 06/18/2016 CLINICAL DATA:  Altered mental status EXAM: CT HEAD WITHOUT CONTRAST TECHNIQUE: Contiguous axial images were obtained from the base of the skull through the vertex without intravenous contrast. COMPARISON:  May 06, 2016 FINDINGS: Brain: Moderate diffuse atrophy is stable. There is no intracranial mass, hemorrhage, extra-axial fluid collection, or midline shift. Gray-white compartments appear normal. No acute infarct evident. Vascular: No hyperdense vessels evident. There is calcification in each carotid siphon region. Skull: The bony calvarium appears intact. Sinuses/Orbits: Visualized paranasal sinuses are clear. Orbits appear symmetric bilaterally. Other: Mastoid air cells are clear. IMPRESSION: Moderate diffuse atrophy. No intracranial mass, hemorrhage, or extra-axial fluid collection. Gray-white compartments are normal. There are foci of carotid artery calcification in the carotid siphon regions. Electronically Signed   By: Bretta BangWilliam  Woodruff III M.D.   On: 06/18/2016 14:01  "  I personally reviewed the radiologic studies     ED Course:  We are currently waiting for laboratory work. Review of patient's record there is indications of previous agitation, etc. His behavior does not seem to be significantly out of the norm for the patient per the past record here. Patient was given by mouth Haldol here in emergency department he'll be observed and his laboratory work followed. Psychiatric consult  [facility] as there is no Psychiatrist present today for evaluation. Clinical Course      Assessment: * Agitation     Plan:  Follow the patient's laboratory work and likely transfer back to the nursing facility            Jennye MoccasinBrian S Quigley, MD 06/18/16 1440
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
## 2016-06-18 NOTE — ED Notes (Signed)
[facility] [phone]

## 2016-06-21 ENCOUNTER — Emergency Department
Admission: EM | Admit: 2016-06-21 | Discharge: 2016-06-23 | Disposition: A | Discharge status: Other Acute Inpatient Hospital | Payer: Medicare Other | Attending: Emergency Medicine | Admitting: Emergency Medicine

## 2016-06-21 ENCOUNTER — Encounter: Payer: Self-pay | Admitting: Emergency Medicine

## 2016-06-21 DIAGNOSIS — F250 Schizoaffective disorder, bipolar type: Secondary | ICD-10-CM

## 2016-06-21 DIAGNOSIS — Z79899 Other long term (current) drug therapy: Secondary | ICD-10-CM | POA: Insufficient documentation

## 2016-06-21 DIAGNOSIS — F918 Other conduct disorders: Secondary | ICD-10-CM | POA: Insufficient documentation

## 2016-06-21 DIAGNOSIS — Z7901 Long term (current) use of anticoagulants: Secondary | ICD-10-CM | POA: Diagnosis not present

## 2016-06-21 DIAGNOSIS — F319 Bipolar disorder, unspecified: Secondary | ICD-10-CM | POA: Insufficient documentation

## 2016-06-21 DIAGNOSIS — R4689 Other symptoms and signs involving appearance and behavior: Secondary | ICD-10-CM

## 2016-06-21 DIAGNOSIS — E039 Hypothyroidism, unspecified: Secondary | ICD-10-CM

## 2016-06-21 DIAGNOSIS — K219 Gastro-esophageal reflux disease without esophagitis: Secondary | ICD-10-CM

## 2016-06-21 LAB — URINALYSIS, COMPLETE (UACMP) WITH MICROSCOPIC
BACTERIA UA: NONE SEEN
BILIRUBIN URINE: NEGATIVE
Glucose, UA: NEGATIVE mg/dL
Hgb urine dipstick: NEGATIVE
KETONES UR: NEGATIVE mg/dL
Leukocytes, UA: NEGATIVE
Nitrite: NEGATIVE
PROTEIN: NEGATIVE mg/dL
Specific Gravity, Urine: 1.01 (ref 1.005–1.030)
WBC UA: NONE SEEN WBC/hpf (ref 0–5)
pH: 6 (ref 5.0–8.0)

## 2016-06-21 LAB — BASIC METABOLIC PANEL
Anion gap: 6 (ref 5–15)
BUN: 24 mg/dL — AB (ref 6–20)
CHLORIDE: 108 mmol/L (ref 101–111)
CO2: 26 mmol/L (ref 22–32)
CREATININE: 0.98 mg/dL (ref 0.61–1.24)
Calcium: 8.5 mg/dL — ABNORMAL LOW (ref 8.9–10.3)
GFR calc Af Amer: >60 mL/min (ref >60)
GFR calc non Af Amer: >60 mL/min (ref >60)
Glucose, Bld: 167 mg/dL — ABNORMAL HIGH (ref 65–99)
Potassium: 3.5 mmol/L (ref 3.5–5.1)
Sodium: 140 mmol/L (ref 135–145)

## 2016-06-21 LAB — URINE DRUG SCREEN, QUALITATIVE (ARMC ONLY)
AMPHETAMINES, UR SCREEN: NOT DETECTED
BARBITURATES, UR SCREEN: NOT DETECTED
Benzodiazepine, Ur Scrn: NOT DETECTED
COCAINE METABOLITE, UR ~~LOC~~: NOT DETECTED
Cannabinoid 50 Ng, Ur ~~LOC~~: NOT DETECTED
MDMA (Ecstasy)Ur Screen: NOT DETECTED
METHADONE SCREEN, URINE: NOT DETECTED
OPIATE, UR SCREEN: NOT DETECTED
Phencyclidine (PCP) Ur S: NOT DETECTED
Tricyclic, Ur Screen: NOT DETECTED

## 2016-06-21 LAB — CBC
HCT: 34.9 % — ABNORMAL LOW (ref 40.0–52.0)
HEMOGLOBIN: 11.9 g/dL — AB (ref 13.0–18.0)
MCH: 33.2 pg (ref 26.0–34.0)
MCHC: 34 g/dL (ref 32.0–36.0)
MCV: 97.5 fL (ref 80.0–100.0)
Platelets: 189 10*3/uL (ref 150–440)
RBC: 3.58 MIL/uL — ABNORMAL LOW (ref 4.40–5.90)
RDW: 12.9 % (ref 11.5–14.5)
WBC: 11.5 10*3/uL — ABNORMAL HIGH (ref 3.8–10.6)

## 2016-06-21 LAB — SALICYLATE LEVEL: Salicylate Lvl: <7 mg/dL (ref 2.8–30.0)

## 2016-06-21 LAB — ETHANOL

## 2016-06-21 LAB — ACETAMINOPHEN LEVEL: Acetaminophen (Tylenol), Serum: <10 ug/mL — ABNORMAL LOW (ref 10–30)

## 2016-06-21 MED ORDER — HALOPERIDOL 5 MG PO TABS
ORAL_TABLET | ORAL | Status: AC
  Administered 2016-06-21: 5 mg via ORAL
  Filled 2016-06-21: qty 1

## 2016-06-21 MED ORDER — HALOPERIDOL LACTATE 5 MG/ML IJ SOLN: 5.0000 mg | INTRAMUSCULAR | Status: DC | PRN

## 2016-06-21 MED ORDER — HALOPERIDOL 5 MG PO TABS
5.0000 mg | ORAL_TABLET | Freq: Once | ORAL | Status: AC
  Administered 2016-06-21: 5 mg via ORAL

## 2016-06-21 MED ORDER — RISPERIDONE 1 MG PO TABS
3.0000 mg | ORAL_TABLET | Freq: Two times a day (BID) | ORAL | Status: DC
  Administered 2016-06-21 – 2016-06-22 (×3): 3 mg via ORAL
  Filled 2016-06-21 (×3): qty 3

## 2016-06-21 MED ORDER — DONEPEZIL HCL 5 MG PO TABS
10.0000 mg | ORAL_TABLET | Freq: Every day | ORAL | Status: DC
  Administered 2016-06-21 – 2016-06-22 (×2): 10 mg via ORAL
  Filled 2016-06-21 (×2): qty 2

## 2016-06-21 MED ORDER — RISPERIDONE 1 MG PO TABS
2.0000 mg | ORAL_TABLET | Freq: Two times a day (BID) | ORAL | Status: DC
  Administered 2016-06-21 – 2016-06-22 (×3): 2 mg via ORAL
  Filled 2016-06-21 (×3): qty 2

## 2016-06-21 NOTE — ED Notes (Signed)
[facility] contacted for pts Medication list. Fax number provided.
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
## 2016-06-21 NOTE — ED Notes (Signed)
Gave patient a coke 

## 2016-06-21 NOTE — ED Notes (Signed)
Pt given dinner tray.

## 2016-06-21 NOTE — ED Notes (Signed)
Pt given snack tray and sprite.  
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
## 2016-06-22 DIAGNOSIS — E039 Hypothyroidism, unspecified: Secondary | ICD-10-CM

## 2016-06-22 DIAGNOSIS — Z7901 Long term (current) use of anticoagulants: Secondary | ICD-10-CM

## 2016-06-22 DIAGNOSIS — F918 Other conduct disorders: Secondary | ICD-10-CM | POA: Diagnosis not present

## 2016-06-22 DIAGNOSIS — F250 Schizoaffective disorder, bipolar type: Secondary | ICD-10-CM | POA: Diagnosis not present

## 2016-06-22 DIAGNOSIS — K219 Gastro-esophageal reflux disease without esophagitis: Secondary | ICD-10-CM

## 2016-06-22 MED ORDER — LITHIUM CARBONATE ER 300 MG PO TBCR: 300.0000 mg | EXTENDED_RELEASE_TABLET | Freq: Two times a day (BID) | ORAL | Status: DC

## 2016-06-22 MED ORDER — WARFARIN SODIUM 5 MG PO TABS
5.0000 mg | ORAL_TABLET | Freq: Every day | ORAL | Status: DC
  Administered 2016-06-22: 5 mg via ORAL
  Filled 2016-06-22 (×2): qty 1

## 2016-06-22 MED ORDER — ACETAMINOPHEN 500 MG PO TABS: 500.0000 mg | ORAL_TABLET | Freq: Two times a day (BID) | ORAL | Status: DC | PRN

## 2016-06-22 MED ORDER — ESCITALOPRAM OXALATE 10 MG PO TABS
10.0000 mg | ORAL_TABLET | Freq: Every day | ORAL | Status: DC
  Administered 2016-06-22: 10 mg via ORAL
  Filled 2016-06-22: qty 1

## 2016-06-22 MED ORDER — LEVOTHYROXINE SODIUM 50 MCG PO TABS
25.0000 ug | ORAL_TABLET | Freq: Every day | ORAL | Status: DC
  Administered 2016-06-22: 25 ug via ORAL
  Filled 2016-06-22: qty 1

## 2016-06-22 MED ORDER — MEMANTINE HCL 5 MG PO TABS
10.0000 mg | ORAL_TABLET | Freq: Two times a day (BID) | ORAL | Status: DC
  Administered 2016-06-22 (×2): 10 mg via ORAL
  Filled 2016-06-22 (×2): qty 2

## 2016-06-22 MED ORDER — CHOLECALCIFEROL 10 MCG (400 UNIT) PO TABS: 50000.0000 [IU] | ORAL_TABLET | ORAL | Status: DC

## 2016-06-22 MED ORDER — LITHIUM CARBONATE 150 MG PO CAPS
150.0000 mg | ORAL_CAPSULE | Freq: Two times a day (BID) | ORAL | Status: DC
  Filled 2016-06-22 (×2): qty 1

## 2016-06-22 MED ORDER — HALOPERIDOL 5 MG PO TABS
2.5000 mg | ORAL_TABLET | Freq: Every day | ORAL | Status: DC
  Administered 2016-06-22: 2.5 mg via ORAL
  Filled 2016-06-22: qty 1

## 2016-06-22 MED ORDER — BENZTROPINE MESYLATE 0.5 MG PO TABS
1.0000 mg | ORAL_TABLET | Freq: Two times a day (BID) | ORAL | Status: DC
  Administered 2016-06-22 (×2): 1 mg via ORAL
  Filled 2016-06-22 (×3): qty 2

## 2016-06-22 MED ORDER — PANTOPRAZOLE SODIUM 40 MG PO TBEC
40.0000 mg | DELAYED_RELEASE_TABLET | Freq: Every day | ORAL | Status: DC
  Administered 2016-06-22: 40 mg via ORAL
  Filled 2016-06-22: qty 1

## 2016-06-22 MED ORDER — WARFARIN - PHYSICIAN DOSING INPATIENT
Freq: Every day | Status: DC
  Filled 2016-06-22 (×2): qty 1

## 2016-06-22 MED ORDER — CHLORPROMAZINE HCL 50 MG PO TABS
50.0000 mg | ORAL_TABLET | Freq: Two times a day (BID) | ORAL | Status: DC
  Administered 2016-06-22 (×2): 50 mg via ORAL
  Filled 2016-06-22 (×3): qty 1

## 2016-06-22 MED ORDER — POLYETHYLENE GLYCOL 3350 17 G PO PACK
17.0000 g | PACK | Freq: Every day | ORAL | Status: DC
  Administered 2016-06-22: 17 g via ORAL
  Filled 2016-06-22: qty 1

## 2016-06-22 NOTE — ED Provider Notes (Signed)
-----------------------------------------   6:18 AM on 06/22/2016 -----------------------------------------   Blood pressure 135/84, pulse 69, temperature 98.1 F (36.7 C), temperature source Oral, resp. rate 18, weight 171 lb (77.6 kg), SpO2 100 %.  The patient had no acute events since last update.  Calm and cooperative at this time.  Disposition is pending Psychiatry/Behavioral Medicine team recommendations.     Irean HongJade J Kayl Stogdill, MD 06/22/16 [phone]

## 2016-06-22 NOTE — ED Notes (Signed)
Dr.clapaas in room with patient. 

## 2016-06-22 NOTE — BH Assessment (Signed)
Paperwork faxed for review:  [address], [place], [place], [place], [address], [place], [address] [place], [place], [place], [place], [place]

## 2016-06-22 NOTE — ED Notes (Signed)
Patient refused shower. 

## 2016-06-22 NOTE — Progress Notes (Signed)
TTS received voluntary admission consent from [place]enn[place] at [place] [phone]).  TTS provided it to the patient, he stated that he did not have a legal guardian and that he could complete his own paperwork.  Patient signed the form. TTS faxed the forms back to [place] [phone])

## 2016-06-22 NOTE — ED Notes (Signed)
Kim called in pharmacy for coumadin and Thorazine orders.

## 2016-06-22 NOTE — ED Notes (Signed)
Patient up to bathroom

## 2016-06-22 NOTE — Consult Note (Signed)
[place] Psychiatry Consult   Reason for Consult:  Consult for 66 year old man brought here from [facility] manner because of aggressive behavior Referring Physician:  Jimmye Norman Patient Identification: FREDRICH CORY MRN:  [id number] Principal Diagnosis: Schizoaffective disorder, bipolar type [facility]) Diagnosis:   Patient Active Problem List   Diagnosis Date Noted  . Schizoaffective disorder, bipolar type ([place]) [F25.0] 06/22/2016  . GERD (gastroesophageal reflux disease) [K21.9] 06/22/2016  . Current use of anticoagulant therapy [Z79.01] 06/22/2016  . Hypothyroid [E03.9] 06/22/2016  . Malnutrition of moderate degree [E44.0] 03/04/2016  . Altered mental status [R41.82] 02/26/2016  . Respiratory failure ([place]) [J96.90] 02/26/2016    Total Time spent with patient: 1 hour  Subjective:   TURRELL SEVERT is a 66 y.o. male patient admitted with "I'm sorry, I just couldn't help it".  HPI:  Patient interviewed. Chart reviewed. Labs and vitals and medications reviewed. This 66 year old man who is brought over here from [facility] manner with a report that he had shown aggressive behavior. They described him being aggressive threatening loud and uncooperative with staff. Patient was just seen here in the emergency room a few days previously for similar complaints and was discharged back to [facility] at that time. On interview today the patient admits that he was behaving inappropriately. He actually became a little bit tearful describing it to me and told me that he couldn't help himself that he knows that he is not feeling completely right area he says that he has a nervous condition and wonders if he needs more "Thorazine". He tells me that as far as he knows he's been sleeping more or less okay. Admits that he sometimes does hear the voice of God talking to him. He adamantly denies any actual wish to harm anyone or harm himself. He is actually quite appropriate in conversation but has enough insight to  tell me that he doesn't feel like it would be a good idea for him to go back to his living place yet because his nerves or not quite right. As far as I can tell there hasn't been any particular new stressor that I can put my finger on. Patient does haven't have any specific psychosocial complaints. His labs look pretty steady nothing really stands out no sign of infection.  Social history: 65 year old gentleman who is currently living at [facility] manner. Not sure exactly how long he has been living there or whether this is a new placement. Patient tells me his closest relative is his brother. He tells me he does not know of any children that he has. He says that he worked at [employer] for many years and that was his highest level of employment.  Medical history: Patient has a history of thrombosis and is on chronic anticoagulant therapy. Hypothyroidism gastric reflux. No known history of heart disease diabetes or stroke. He has tardive dyskinesia pretty obviously from observation.  Substance abuse history: He denies ever having had a problem with alcohol or drug abuse  Past Psychiatric History: Patient tells me that he had psychiatric hospitalizations in the past at [facility]. He carries a diagnosis of either bipolar disorder or schizoaffective disorder on his chart. He is on 3 different antipsychotics at the moment. Not sure what the history of his medications were in the past. He denies ever having tried to kill himself or been really violent in the past.  Risk to Self: Suicidal Ideation: No Suicidal Intent: No Is patient at risk for suicide?: No  Suicidal Plan?: No Access to Means: No What has been your use of drugs/alcohol within the last 12 months?: Reports of none How many times?: 0 Other Self Harm Risks: Reports of none Triggers for Past Attempts: None known Intentional Self Injurious Behavior: None Risk to Others: Homicidal Ideation: No Thoughts of Harm to Others:  No Current Homicidal Intent: No Current Homicidal Plan: No Access to Homicidal Means: No Identified Victim: Reports of none History of harm to others?: Yes Assessment of Violence: In distant past Violent Behavior Description: [facility] staff and other residents Does patient have access to weapons?: No Criminal Charges Pending?: No Does patient have a court date: No Prior Inpatient Therapy: Prior Inpatient Therapy: Yes Prior Therapy Dates: 1990's Prior Therapy Facilty/Provider(s): "[facility]" Reason for Treatment: "Oh, I had some problems but I'm good now." (Patient only give vague answers. ) Prior Outpatient Therapy: Prior Outpatient Therapy: Yes Prior Therapy Dates: Current Prior Therapy Facilty/Provider(s): [facility] (Per information provided by Care Home) Reason for Treatment: Unspecified Dementia w/behavioral disturbance. (Per information provided by Care Home) Does patient have an ACCT team?: No Does patient have Intensive In-House Services?  : No Does patient have [facility] services? : No Does patient have P4CC services?: No  Past Medical History:  Past Medical History:  Diagnosis Date  . Anxiety   . Bipolar 1 disorder ([place])   . Bone disorder   . Depression   . GERD (gastroesophageal reflux disease)    History reviewed. No pertinent surgical history. Family History:  Family History  Problem Relation Age of Onset  . Family history unknown: Yes   Family Psychiatric  History: He does not know of any family history Social History:  History  Alcohol Use No     History  Drug use: Unknown    Social History   Social History  . Marital status: Widowed    Spouse name: N/A  . Number of children: N/A  . Years of education: N/A   Social History Main Topics  . Smoking status: Never Smoker  . Smokeless tobacco: Never Used  . Alcohol use No  . Drug use: Unknown  . Sexual activity: Not Asked   Other Topics Concern  . None   Social  History Narrative  . None   Additional Social History:    Allergies:  No Known Allergies  Labs:  Results for orders placed or performed during the hospital encounter of 06/21/16 (from the past 48 hour(s))  CBC     Status: Abnormal   Collection Time: 06/21/16  2:12 PM  Result Value Ref Range   WBC 11.5 (H) 3.8 - 10.6 K/uL   RBC 3.58 (L) 4.40 - 5.90 MIL/uL   Hemoglobin 11.9 (L) 13.0 - 18.0 g/dL   HCT 34.9 (L) 40.0 - 52.0 %   MCV 97.5 80.0 - 100.0 fL   MCH 33.2 26.0 - 34.0 pg   MCHC 34.0 32.0 - 36.0 g/dL   RDW 12.9 11.5 - 14.5 %   Platelets 189 150 - 440 K/uL  Basic metabolic panel     Status: Abnormal   Collection Time: 06/21/16  2:12 PM  Result Value Ref Range   Sodium 140 135 - 145 mmol/L   Potassium 3.5 3.5 - 5.1 mmol/L   Chloride 108 101 - 111 mmol/L   CO2 26 22 - 32 mmol/L   Glucose, Bld 167 (H) 65 - 99 mg/dL   BUN 24 (H) 6 - 20 mg/dL   Creatinine, Ser 0.98  0.61 - 1.24 mg/dL   Calcium 8.5 (L) 8.9 - 10.3 mg/dL   GFR calc non Af Amer >60 >60 mL/min   GFR calc Af Amer >60 >60 mL/min    Comment: (NOTE) The eGFR has been calculated using the CKD EPI equation. This calculation has not been validated in all clinical situations. eGFR's persistently <60 mL/min signify possible Chronic Kidney Disease.    Anion gap 6 5 - 15  Ethanol     Status: None   Collection Time: 06/21/16  2:12 PM  Result Value Ref Range   Alcohol, Ethyl (B) <5 <5 mg/dL    Comment:        LOWEST DETECTABLE LIMIT FOR SERUM ALCOHOL IS 5 mg/dL FOR MEDICAL PURPOSES ONLY   Acetaminophen level     Status: Abnormal   Collection Time: 06/21/16  2:12 PM  Result Value Ref Range   Acetaminophen (Tylenol), Serum <10 (L) 10 - 30 ug/mL    Comment:        THERAPEUTIC CONCENTRATIONS VARY SIGNIFICANTLY. A RANGE OF 10-30 ug/mL MAY BE AN EFFECTIVE CONCENTRATION FOR MANY PATIENTS. HOWEVER, SOME ARE BEST TREATED AT CONCENTRATIONS OUTSIDE THIS RANGE. ACETAMINOPHEN CONCENTRATIONS >150 ug/mL AT 4 HOURS  AFTER INGESTION AND >50 ug/mL AT 12 HOURS AFTER INGESTION ARE OFTEN ASSOCIATED WITH TOXIC REACTIONS.   Salicylate level     Status: None   Collection Time: 06/21/16  2:12 PM  Result Value Ref Range   Salicylate Lvl <2.7 2.8 - 30.0 mg/dL  Urine Drug Screen, Qualitative ([facility] only)     Status: None   Collection Time: 06/21/16  5:54 PM  Result Value Ref Range   Tricyclic, Ur Screen NONE DETECTED NONE DETECTED   Amphetamines, Ur Screen NONE DETECTED NONE DETECTED   MDMA (Ecstasy)Ur Screen NONE DETECTED NONE DETECTED   Cocaine Metabolite,Ur [place] NONE DETECTED NONE DETECTED   Opiate, Ur Screen NONE DETECTED NONE DETECTED   Phencyclidine (PCP) Ur S NONE DETECTED NONE DETECTED   Cannabinoid 50 Ng, Ur [place] NONE DETECTED NONE DETECTED   Barbiturates, Ur Screen NONE DETECTED NONE DETECTED   Benzodiazepine, Ur Scrn NONE DETECTED NONE DETECTED   Methadone Scn, Ur NONE DETECTED NONE DETECTED    Comment: (NOTE) 062  Tricyclics, urine               Cutoff 1000 ng/mL 200  Amphetamines, urine             Cutoff 1000 ng/mL 300  MDMA (Ecstasy), urine           Cutoff 500 ng/mL 400  Cocaine Metabolite, urine       Cutoff 300 ng/mL 500  Opiate, urine                   Cutoff 300 ng/mL 600  Phencyclidine (PCP), urine      Cutoff 25 ng/mL 700  Cannabinoid, urine              Cutoff 50 ng/mL 800  Barbiturates, urine             Cutoff 200 ng/mL 900  Benzodiazepine, urine           Cutoff 200 ng/mL 1000 Methadone, urine                Cutoff 300 ng/mL 1100 1200 The urine drug screen provides only a preliminary, unconfirmed 1300 analytical test result and should not be used for non-medical 1400 purposes. Clinical consideration and professional judgment should 1500 be applied  to any positive drug screen result due to possible 1600 interfering substances. A more specific alternate chemical method 1700 must be used in order to obtain a confirmed analytical result.  1800 Gas chromato graphy / mass  spectrometry (GC/MS) is the preferred 1900 confirmatory method.   Urinalysis, Complete w Microscopic     Status: Abnormal   Collection Time: 06/21/16  5:54 PM  Result Value Ref Range   Color, Urine YELLOW (A) YELLOW   APPearance CLEAR (A) CLEAR   Specific Gravity, Urine 1.010 1.005 - 1.030   pH 6.0 5.0 - 8.0   Glucose, UA NEGATIVE NEGATIVE mg/dL   Hgb urine dipstick NEGATIVE NEGATIVE   Bilirubin Urine NEGATIVE NEGATIVE   Ketones, ur NEGATIVE NEGATIVE mg/dL   Protein, ur NEGATIVE NEGATIVE mg/dL   Nitrite NEGATIVE NEGATIVE   Leukocytes, UA NEGATIVE NEGATIVE   RBC / HPF 0-5 0 - 5 RBC/hpf   WBC, UA NONE SEEN 0 - 5 WBC/hpf   Bacteria, UA NONE SEEN NONE SEEN   Squamous Epithelial / LPF 0-5 (A) NONE SEEN   Mucous PRESENT     Current Facility-Administered Medications  Medication Dose Route Frequency Provider Last Rate Last Dose  . acetaminophen (TYLENOL) tablet 500 mg  500 mg Oral BID PRN Earleen Newport, MD      . benztropine (COGENTIN) tablet 1 mg  1 mg Oral BID Earleen Newport, MD   1 mg at 06/22/16 0951  . chlorproMAZINE (THORAZINE) tablet 50 mg  50 mg Oral BID Earleen Newport, MD      . Derrill Memo ON 06/28/2016] cholecalciferol (VITAMIN D) tablet 50,000 Units  50,000 Units Oral Weekly Earleen Newport, MD      . donepezil (ARICEPT) tablet 10 mg  10 mg Oral QHS Nance Pear, MD   10 mg at 06/21/16 2251  . escitalopram (LEXAPRO) tablet 10 mg  10 mg Oral Daily Earleen Newport, MD   10 mg at 06/22/16 2671  . haloperidol (HALDOL) tablet 2.5 mg  2.5 mg Oral Daily Earleen Newport, MD      . haloperidol lactate (HALDOL) injection 5 mg  5 mg Intramuscular Q2H PRN Nance Pear, MD      . levothyroxine (SYNTHROID, LEVOTHROID) tablet 25 mcg  25 mcg Oral QAC breakfast Earleen Newport, MD   25 mcg at 06/22/16 [phone]  . lithium carbonate capsule 150 mg  150 mg Oral BID WC Gonzella Lex, MD      . memantine [facility]) tablet 10 mg  10 mg Oral BID Earleen Newport, MD   10  mg at 06/22/16 0998  . pantoprazole (PROTONIX) EC tablet 40 mg  40 mg Oral Daily Earleen Newport, MD   40 mg at 06/22/16 3382  . polyethylene glycol (MIRALAX / GLYCOLAX) packet 17 g  17 g Oral Daily Earleen Newport, MD   17 g at 06/22/16 0951  . risperiDONE (RISPERDAL) tablet 2 mg  2 mg Oral BID Nance Pear, MD   2 mg at 06/22/16 5053  . risperiDONE (RISPERDAL) tablet 3 mg  3 mg Oral BID Nance Pear, MD   3 mg at 06/22/16 0954  . warfarin (COUMADIN) tablet 5 mg  5 mg Oral Daily Earleen Newport, MD      . Warfarin - Physician Dosing Inpatient   Does not apply [id number] Earleen Newport, MD       Current Outpatient Prescriptions  Medication Sig Dispense Refill  . acetaminophen (TYLENOL) 500 MG tablet  Take 500 mg by mouth 2 (two) times daily as needed.    . benztropine (COGENTIN) 1 MG tablet Take 1 mg by mouth 2 (two) times daily.     . chlorproMAZINE (THORAZINE) 50 MG tablet Take 50 mg by mouth 2 (two) times daily.    Marland Kitchen donepezil (ARICEPT) 10 MG tablet Take 10 mg by mouth at bedtime.    Marland Kitchen escitalopram (LEXAPRO) 10 MG tablet Take 10 mg by mouth daily.    . haloperidol (HALDOL) 5 MG tablet Take 0.5 tablets (2.5 mg total) by mouth 2 (two) times daily. 2nd dose at 1600    . haloperidol lactate (HALDOL) 5 MG/ML injection Inject 5 mg into the muscle every 2 (two) hours as needed.    Marland Kitchen levothyroxine (SYNTHROID, LEVOTHROID) 25 MCG tablet Take 25 mcg by mouth every morning.    . memantine (NAMENDA) 10 MG tablet Take 10 mg by mouth 2 (two) times daily.    Marland Kitchen omeprazole (PRILOSEC) 40 MG capsule Take 40 mg by mouth daily.    . polyethylene glycol (MIRALAX / GLYCOLAX) packet Take 17 g by mouth daily.    . risperiDONE (RISPERDAL) 2 MG tablet Take 2 mg by mouth 2 (two) times daily.     . risperiDONE (RISPERDAL) 3 MG tablet Take 3 mg by mouth 2 (two) times daily.    . Vitamin D, Ergocalciferol, (DRISDOL) 50000 units CAPS capsule Take 50,000 Units by mouth every 7 (seven) days.    Marland Kitchen warfarin  (COUMADIN) 5 MG tablet Take 1 tablet (5 mg total) by mouth one time only at 6 PM.    . alendronate (FOSAMAX) 70 MG tablet Take 70 mg by mouth once a week. Take with a full glass of water on an empty stomach.    . ALPRAZolam (XANAX) 1 MG tablet Take 1 tablet (1 mg total) by mouth 2 (two) times daily as needed for anxiety. (Patient not taking: Reported on 06/21/2016) 30 tablet 0  . enoxaparin (LOVENOX) 150 MG/ML injection Inject 0.46 mLs (70 mg total) into the skin every 12 (twelve) hours. 0 Syringe   . omega-3 acid ethyl esters (LOVAZA) 1 g capsule Take 1 g by mouth 2 (two) times daily.      Musculoskeletal: Strength & Muscle Tone: decreased Gait & Station: unsteady Patient leans: N/A  Psychiatric Specialty Exam: Physical Exam  Nursing note and vitals reviewed. Constitutional: He appears well-developed and well-nourished.  HENT:  Head: Normocephalic and atraumatic.  Eyes: Conjunctivae are normal. Pupils are equal, round, and reactive to light.  Neck: Normal range of motion.  Cardiovascular: Regular rhythm and normal heart sounds.   Respiratory: Effort normal. No respiratory distress.  GI: Soft.  Musculoskeletal: Normal range of motion.  Neurological: He is alert. He displays tremor. He exhibits abnormal muscle tone.  Skin: Skin is warm and dry.  Psychiatric: His speech is normal and behavior is normal. Judgment normal. His affect is labile. Thought content is not paranoid. Cognition and memory are normal. He expresses no homicidal and no suicidal ideation.  Patient's affect was not so much labile has just slightly a little more euphoric than might be expected under the circumstances    Review of Systems  Constitutional: Negative.   HENT: Negative.   Eyes: Negative.   Respiratory: Negative.   Cardiovascular: Negative.   Gastrointestinal: Negative.   Musculoskeletal: Negative.   Skin: Negative.   Neurological: Positive for tremors.  Psychiatric/Behavioral: Positive for  hallucinations. Negative for depression, memory loss, substance abuse and suicidal ideas.  The patient is nervous/anxious. The patient does not have insomnia.     Blood pressure 125/60, pulse 76, temperature 98.3 F (36.8 C), temperature source Oral, resp. rate 15, weight 77.6 kg (171 lb), SpO2 98 %.Body mass index is 24.54 kg/m.  General Appearance: Disheveled  Eye Contact:  Good  Speech:  Normal Rate  Volume:  Normal  Mood:  Euthymic  Affect:  His affect is just slightly euphoric. A little bit giddy. Nothing really striking but also doesn't really look normal. Just slightly hypomanic. Not aggressive at all.  Thought Process:  Goal Directed  Orientation:  Full (Time, Place, and Person)  Thought Content:  Tangential  Suicidal Thoughts:  No  Homicidal Thoughts:  No  Memory:  Immediate;   Good Recent;   Fair Remote;   Fair  Judgement:  Fair  Insight:  Fair  Psychomotor Activity:  Decreased  Concentration:  Concentration: Fair  Recall:  [employer] of Knowledge:  Fair  Language:  Fair  Akathisia:  No  Handed:  Right  AIMS (if indicated):     Assets:  Communication Skills Desire for Improvement Financial Resources/Insurance Housing Physical Health Resilience  ADL's:  Intact  Cognition:  WNL  Sleep:        Treatment Plan Summary: Daily contact with patient to assess and evaluate symptoms and progress in treatment, Medication management and Plan This is 66 year old gentleman sent over here from [facility] for some aggressive behavior 2 times in a matter of a few days. On interview with him I found him to actually be pretty cognitively intact. He was able to remember 2 out of 3 words after a few minutes and he was completely oriented to the date and situation. Able to engage in a pretty lucid conversation and has insight. At first I had suggested to him that we have him go back to [facility] manner but he tells me that he feels like his "nerve problem" is not quite right.  Actually got a little tearful talking about it. This shows pretty good insight but suggests that may be that we should do something more aggressive about adjusting the problem at this point. I have spoken to TTS and ask if we can refer him to a geriatric psychiatry unit. As far as his medication and I am going to add a low dose of lithium carbonate 150 mg twice a day to start with. His kidney function looks fine. I will continue to follow-up regularly. I would like to make it clear for anyone who reviews this that right now he has been completely cooperative with treatment and although he may need a little assistance with his balance he has been able to take care of his feeding and self care without much difficulty and does not appear to have a very significant amount of dementia.  Disposition: Recommend psychiatric Inpatient admission when medically cleared. Supportive therapy provided about ongoing stressors.  Alethia Berthold, MD 06/22/2016 10:59 AM

## 2016-06-22 NOTE — ED Notes (Signed)
Pt given lunch tray and bread on sandwich too hard for patient to eat without teeth. Order placed for mac and cheese per patient's request.

## 2016-06-22 NOTE — Progress Notes (Signed)
TTS received a request for an EKG from Pringle[facility]ennifer at [facility] for admission approval. TTS spoke with Denzil MagnusonIris, RN and made the request.

## 2016-06-23 NOTE — ED Provider Notes (Signed)
-----------------------------------------   5:33 AM on 06/23/2016 -----------------------------------------   Blood pressure (!) 150/74, pulse 62, temperature 98.1 F (36.7 C), temperature source Oral, resp. rate 18, weight 171 lb (77.6 kg), SpO2 96 %.  The patient had no acute events since last update.  Calm and cooperative at this time.  Patient is accepted to [facility] and will be able to be transported this morning.     Irean HongJade J Raileigh Sabater, MD 06/23/16 [id number]

## 2016-06-23 NOTE — ED Notes (Signed)
Pt sent via [facility] transport to [facility].  Pt unable to sign his own transfer form at this time.  Pt transferred out via wheelchair.

## 2016-06-23 NOTE — Progress Notes (Signed)
Patient has been accepted to [facility].  Patient assigned to room 416B Accepting physician is Stephanie SwazilandJordan, NP.  Call report to [phone].  Representative was DIRECTVJennifer.  ER Staff is aware of it [facility].; Dr. Dolores FrameSung, ER MD & Iris Patient's Nurse)

## 2016-06-23 NOTE — ED Notes (Signed)
Per [facility], pt to change out into his clothing due to incliment weather.

## 2016-11-19 DEATH — deceased

## 2017-04-20 IMAGING — CR DG SHOULDER 2+V*R*
1 series · 3 of 3 positions shown · non-contrast
Comparison: None.  02/27/2016 chest radiograph.

CLINICAL DATA: Fall this morning with right shoulder contusion.

EXAM:
RIGHT SHOULDER - 2+ VIEW

[Series 1: dg shoulder right · 0.14mm/px · 3 of 3 slices shown]
[im 1/3]
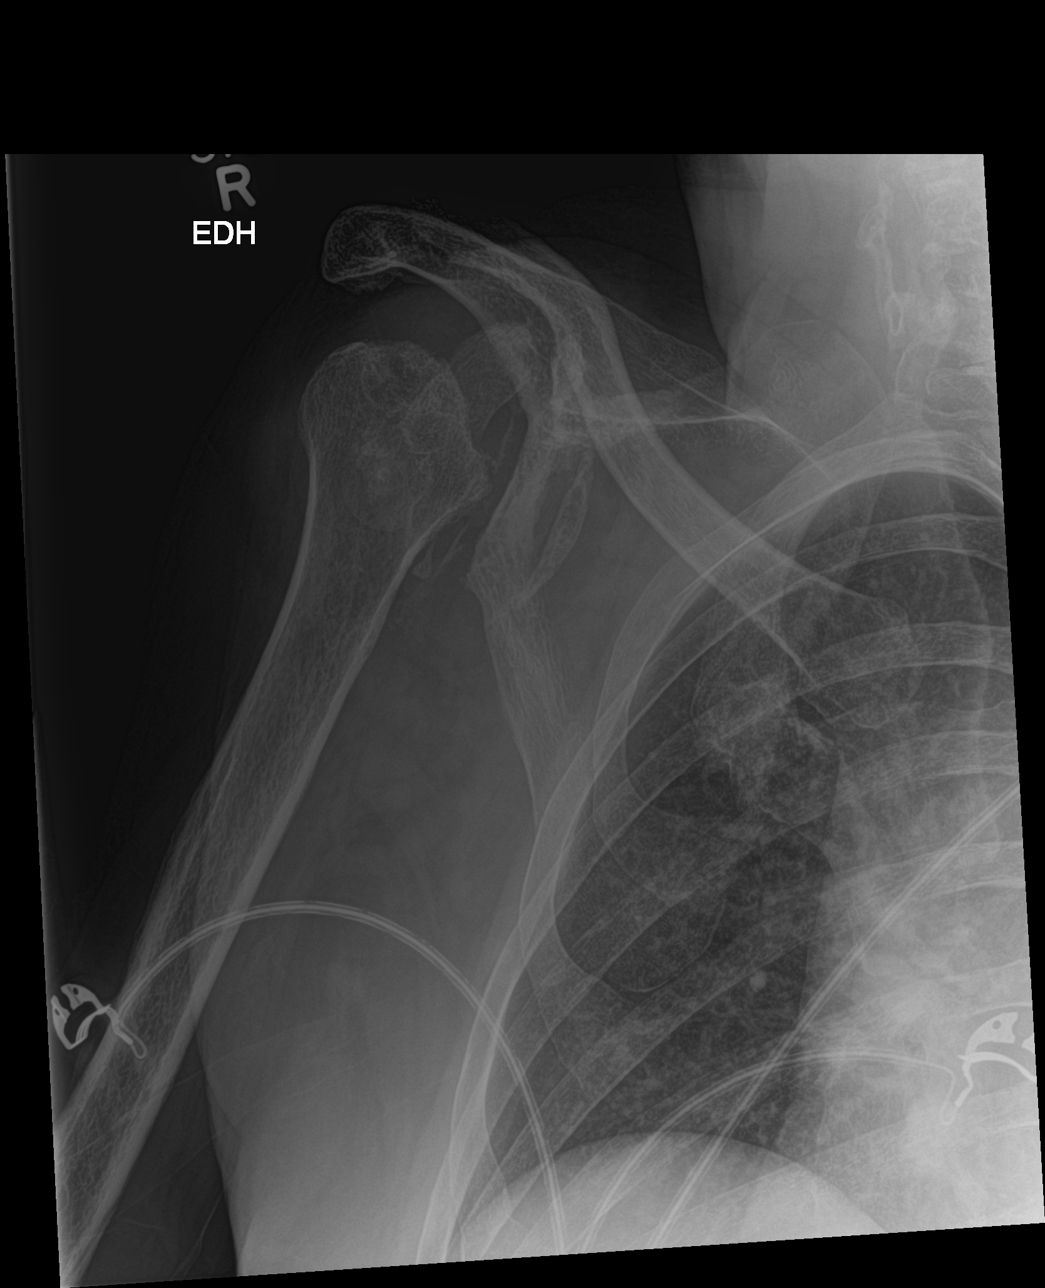
[im 2/3]
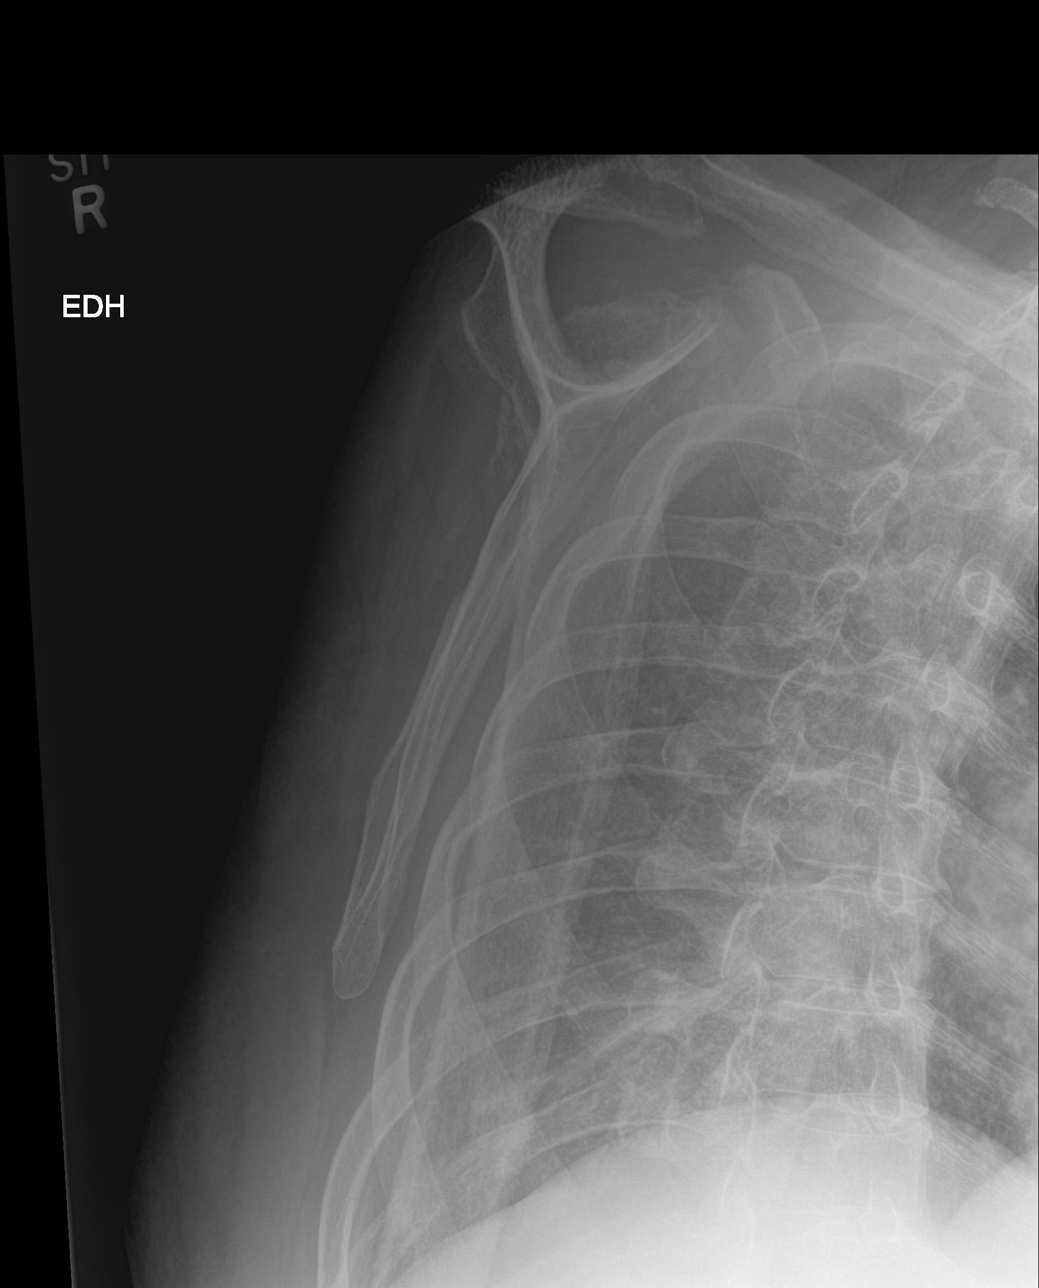
[im 3/3]
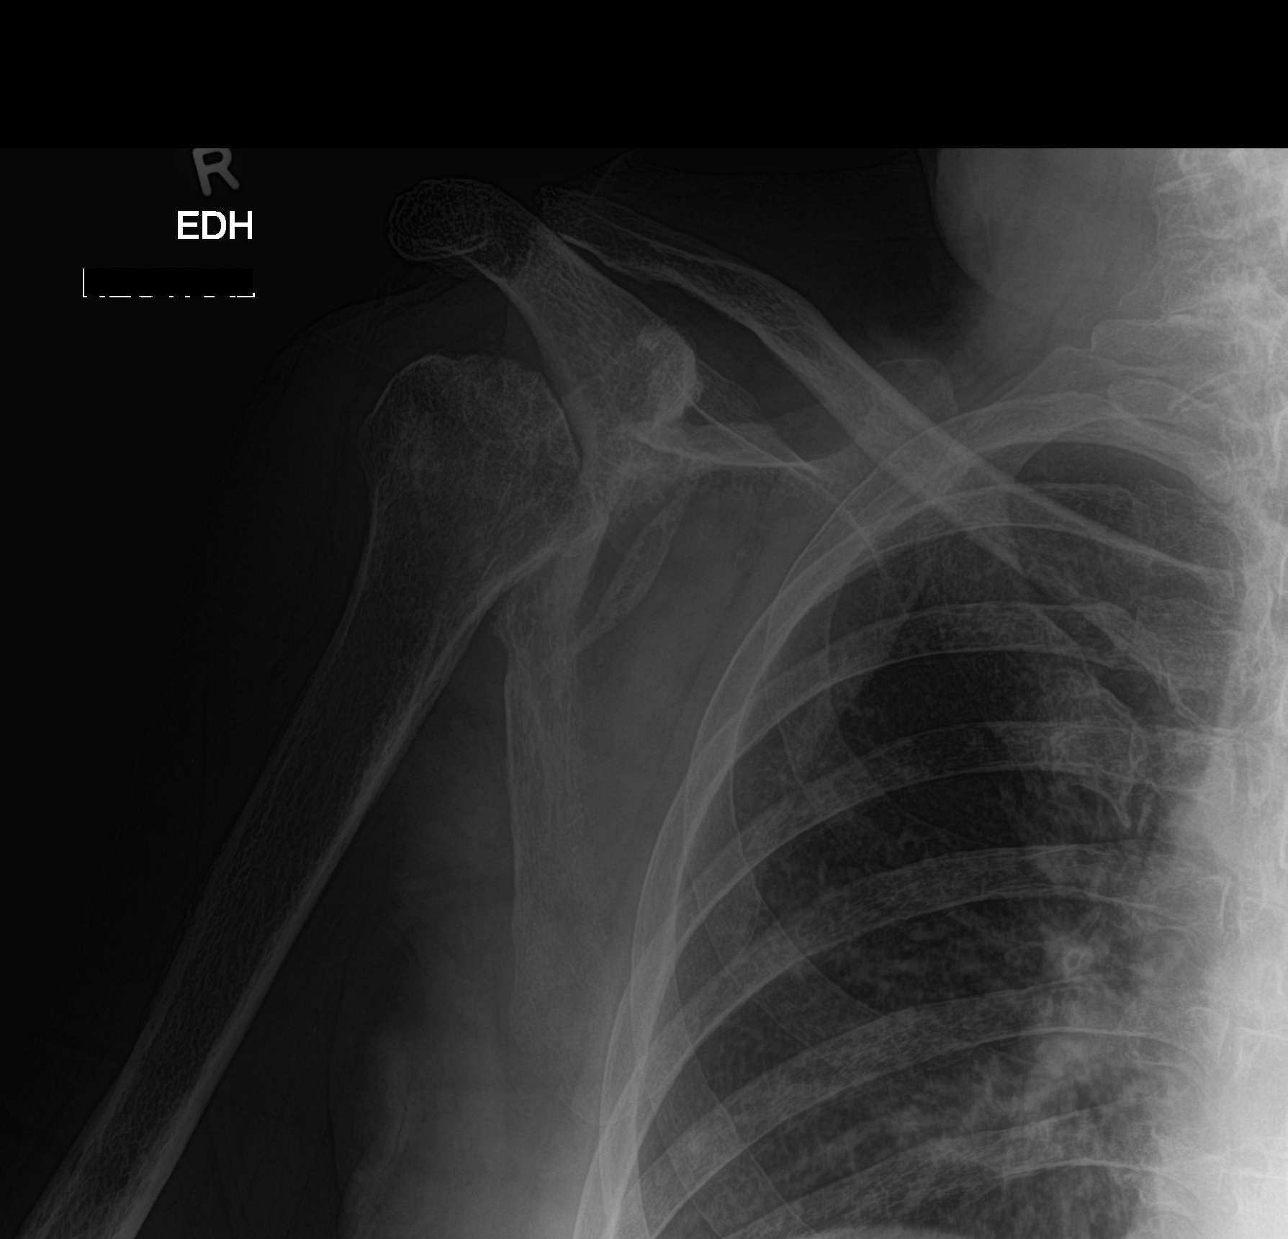

[3 of 3 positions shown; findings below may reference images not displayed]

FINDINGS: No right shoulder fracture. No dislocation at the right glenohumeral
joint. Right acromioclavicular joint appears stable from 02/27/2016.
There is stable hypoplastic appearance of the right humeral head
with suggestion of erosive changes at the margins along the
articular surface of the right humeral head, not appreciably changed
since 02/27/2016. There are stable chronic sheet like calcifications
in the right glenohumeral joint. No suspicious focal osseous
lesions.
IMPRESSION: 1. No fracture or acute malalignment in the right shoulder.
2. Stable hypoplastic appearance of the right humeral head with the
suggestion of erosive changes along the articular surface of the
right humeral head. Stable chronic sheet like calcifications in the
right glenohumeral joint. These findings are suggestive of chronic
arthropathy due to CPPD or neuropathic joint.
